# Patient Record
Sex: Male | Born: 2000
Health system: Southern US, Community
[De-identification: ages and names within clinical notes are randomized; demographics above are authoritative.]

## PROBLEM LIST (undated history)

## (undated) DIAGNOSIS — F840 Autistic disorder: Secondary | ICD-10-CM

## (undated) DIAGNOSIS — J309 Allergic rhinitis, unspecified: Secondary | ICD-10-CM

## (undated) DIAGNOSIS — R55 Syncope and collapse: Secondary | ICD-10-CM

## (undated) DIAGNOSIS — K219 Gastro-esophageal reflux disease without esophagitis: Secondary | ICD-10-CM

## (undated) DIAGNOSIS — F909 Attention-deficit hyperactivity disorder, unspecified type: Secondary | ICD-10-CM

## (undated) DIAGNOSIS — R634 Abnormal weight loss: Secondary | ICD-10-CM

## (undated) DIAGNOSIS — F419 Anxiety disorder, unspecified: Secondary | ICD-10-CM

## (undated) HISTORY — DX: Syncope and collapse: R55

## (undated) HISTORY — DX: Allergic rhinitis, unspecified: J30.9

## (undated) HISTORY — DX: Anxiety disorder, unspecified: F41.9

## (undated) HISTORY — DX: Abnormal weight loss: R63.4

## (undated) HISTORY — DX: Autistic disorder: F84.0

## (undated) HISTORY — DX: Gastro-esophageal reflux disease without esophagitis: K21.9

## (undated) HISTORY — DX: Attention-deficit hyperactivity disorder, unspecified type: F90.9

---
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
## 2018-01-09 ENCOUNTER — Other Ambulatory Visit: Payer: Self-pay

## 2018-01-17 ENCOUNTER — Ambulatory Visit (INDEPENDENT_AMBULATORY_CARE_PROVIDER_SITE_OTHER): Payer: 59 | Admitting: Neurology

## 2018-01-17 ENCOUNTER — Other Ambulatory Visit: Payer: Self-pay

## 2018-01-17 ENCOUNTER — Encounter: Payer: Self-pay | Admitting: Neurology

## 2018-01-17 VITALS — BP 130/66 | HR 80 | Ht 72.0 in | Wt 204.0 lb

## 2018-01-17 DIAGNOSIS — R55 Syncope and collapse: Secondary | ICD-10-CM

## 2018-01-17 DIAGNOSIS — G43109 Migraine with aura, not intractable, without status migrainosus: Secondary | ICD-10-CM | POA: Diagnosis not present

## 2018-01-17 NOTE — Progress Notes (Signed)
NEUROLOGY CONSULTATION NOTE  Theodore Woods MRN: [id number] DOB: 12/22/2000  Referring provider: Dr. Aggie Hacker Primary care provider: Dr. Aggie Hacker  Reason for consult:  Syncope, migraines  Dear Dr Hosie Poisson:  Thank you for your kind referral of Theodore Woods for consultation of the above symptoms. Although his history is well known to you, please allow me to reiterate it for the purpose of our medical record. The patient was accompanied to the clinic by his mother who also provides collateral information. Records and images were personally reviewed where available.  HISTORY OF PRESENT ILLNESS: This is an 17 year old right-handed man with a history of anxiety, ADHD, presenting for evaluation of recurrent episodes of loss of consciousness. He initially started having them when he was at band camp in the summer and was attributed to dehydration. He would start feeling really hot and lightheaded, then pass out. His mother had instructions for co-parents on what to do for him, co-parents have described him as being pale when they come to him. After the syncopal events, he would have a headache with right-sided throbbing, no nausea/vomiting. In December, he had an episode that occurred outside of band. He was standing then started feeling weird like something was wrong, then suddenly had a horrible headache and woke up on the ground. He states this headache was different with a lot more pain, nausea, and headache lasted for several hours after, with sensitivity to light and sound. His vision felt blurred. Another time he was inside an [employer] for a concert. He got up and started having a bad headache and feeling lightheaded. He sat down and felt like he could not move well. He told his teacher and sat inside a room and called his mother. His mother was upset that he was left alone, she "had to keep him from passing out," screaming at him on the phone because he was not making  sense trying to communicate with her. She kept screaming on the phone until he finally said "yeah" and she found him half-slouched on the wall sitting on the floor, pale and clammy with zero energy. He had not been doing anything strenuous that time. The last episode occurred in January, he was driving when he felt lightheaded and his head really hurt, similar to the headache in December. His father switched seats with him, and as he moved to the passenger seat he nearly passed out. They denied any true loss of consciousness this time. The headache again lasted for a few hours. He has not had any further syncopal episodes but has complained of feeling lightheaded. He usually has to lay down and go to sleep, Advil helps some with the headaches.   He and his mother report that he was having daily headaches in January and was "eating Advil." He started Claritin with a noticeable decrease in headaches last month. He has not needed Advil in a couple of weeks. He still has milder headaches around twice a week on the right side, but has talked to his mother about the headaches being all over as well. He has occasional high pitched tinnitus. He has occasional neck pain. He was previously on Lexapro then switched to Prozac last month for anxiety. He has not taken Xanax yet. He takes Concerta daily for ADHD. His mother denies any staring spells. He denies any olfactory/gustatory hallucinations, deja vu, rising epigastric sensation, focal numbness/tingling/weakness, myoclonic jerks. No diplopia, dysarthria/dysphagia, bowel/bladder dysfunction. Sleep is okay, he usually gest 8  hours of sleep. There is a family history of migraines in his mother and maternal grandmother.  He had a normal birth and early development.  There is no history of febrile convulsions, CNS infections such as meningitis/encephalitis, significant traumatic brain injury, neurosurgical procedures, or family history of seizures.  PAST MEDICAL  HISTORY: Past Medical History:  Diagnosis Date  . ADHD (attention deficit hyperactivity disorder)   . Allergic rhinitis   . Anxiety    Followed by Dr. Denman George and Dr. Toni Arthurs  . Autism spectrum disorder   . Weight loss     PAST SURGICAL HISTORY: History reviewed. No pertinent surgical history.  MEDICATIONS: Current Outpatient Medications on File Prior to Visit  Medication Sig Dispense Refill  . ALPRAZolam (XANAX) 0.5 MG tablet TAKE 1/2-1 TABLET AS NEEDED FOR ANXIETY/PANIC  2  . FLUoxetine (PROZAC) 10 MG tablet Take 10 mg by mouth daily.  5  . FLUoxetine (PROZAC) 20 MG capsule Take 20 mg by mouth daily.  12  . loratadine (CLARITIN) 10 MG tablet Take 10 mg by mouth daily as needed. For allergies.    . methylphenidate (CONCERTA) 54 MG CR tablet Take 54 mg by mouth every morning.    Marland Kitchen omeprazole (PRILOSEC) 20 MG capsule Take 20 mg by mouth 2 (two) times daily.  3  . Probiotic Product (PROBIOTIC FORMULA PO) Take 1 tablet by mouth daily.    Marland Kitchen ibuprofen (ADVIL,MOTRIN) 200 MG tablet Take 600 mg by mouth every 6 (six) hours as needed. For pain.     No current facility-administered medications on file prior to visit.     ALLERGIES: No Known Allergies  FAMILY HISTORY: Family History  Problem Relation Age of Onset  . Migraines Mother   . Anxiety disorder Mother   . Asthma Sister   . Heart disease Maternal Grandmother     SOCIAL HISTORY: Social History   Socioeconomic History  . Marital status: Single    Spouse name: Not on file  . Number of children: Not on file  . Years of education: Not on file  . Highest education level: Not on file  Social Needs  . Financial resource strain: Not on file  . Food insecurity - worry: Not on file  . Food insecurity - inability: Not on file  . Transportation needs - medical: Not on file  . Transportation needs - non-medical: Not on file  Occupational History  . Not on file  Tobacco Use  . Smoking status: Never Smoker  . Smokeless tobacco:  Never Used  Substance and Sexual Activity  . Alcohol use: No    Frequency: Never  . Drug use: No  . Sexual activity: Not on file  Other Topics Concern  . Not on file  Social History Narrative  . Not on file    REVIEW OF SYSTEMS: Constitutional: No fevers, chills, or sweats, no generalized fatigue, change in appetite Eyes: No visual changes, double vision, eye pain Ear, nose and throat: No hearing loss, ear pain, nasal congestion, sore throat Cardiovascular: No chest pain, palpitations Respiratory:  No shortness of breath at rest or with exertion, wheezes GastrointestinaI: No nausea, vomiting, diarrhea, abdominal pain, fecal incontinence Genitourinary:  No dysuria, urinary retention or frequency Musculoskeletal:  No neck pain, back pain Integumentary: No rash, pruritus, skin lesions Neurological: as above Psychiatric: No depression, insomnia, anxiety Endocrine: No palpitations, fatigue, diaphoresis, mood swings, change in appetite, change in weight, increased thirst Hematologic/Lymphatic:  No anemia, purpura, petechiae. Allergic/Immunologic: no itchy/runny eyes, nasal congestion, recent  allergic reactions, rashes  PHYSICAL EXAM: Vitals:   01/17/18 0929  BP: 130/66  Pulse: 80  SpO2: 98%   General: No acute distress Head:  Normocephalic/atraumatic Eyes: Fundoscopic exam shows bilateral sharp discs, no vessel changes, exudates, or hemorrhages Neck: supple, no paraspinal tenderness, full range of motion Back: No paraspinal tenderness Heart: regular rate and rhythm Lungs: Clear to auscultation bilaterally. Vascular: No carotid bruits. Skin/Extremities: No rash, no edema Neurological Exam: Mental status: alert and oriented to person, place, and time, no dysarthria or aphasia, Fund of knowledge is appropriate.  Recent and remote memory are intact. 3/3 delayed recall. Attention and concentration are normal.    Able to name objects and repeat phrases. Cranial nerves: CN I: not  tested CN II: pupils equal, round and reactive to light, visual fields intact, fundi unremarkable. CN III, IV, VI:  full range of motion, no nystagmus, no ptosis CN V: facial sensation intact CN VII: upper and lower face symmetric CN VIII: hearing intact to finger rub CN IX, X: gag intact, uvula midline CN XI: sternocleidomastoid and trapezius muscles intact CN XII: tongue midline Bulk & Tone: normal, no fasciculations. Motor: 5/5 throughout with no pronator drift. Sensation: intact to light touch, cold, pin, vibration and joint position sense.  No extinction to double simultaneous stimulation.  Romberg test negative Deep Tendon Reflexes: +2 throughout, no ankle clonus Plantar responses: downgoing bilaterally Cerebellar: no incoordination on finger to nose, heel to shin. No dysdiadochokinesia Gait: narrow-based and steady, able to tandem walk adequately. Tremor: none  IMPRESSION: This is an 17 year old right-handed man with a history of anxiety, ADHD, presenting for recurrent episodes of near syncope/syncope. Etiology of symptoms is unclear, considerations include vasovagal syncope, complicated migraines, cardiac cause (arrhythmias), less likely seizures. MRI brain with and without contrast and a 1-hour EEG will be ordered to assess for focal abnormalities that increase risk for recurrent seizures. Echocardiogram will be ordered for syncope. We discussed how Prozac can be useful as a headache preventative medication, he recently started it this month. He will follow-up after testing and knows to call for any changes.   Thank you for allowing me to participate in the care of this patient. Please do not hesitate to call for any questions or concerns.   Patrcia DollyKaren Nysia Dell, M.D.  CC: Dr. Hosie PoissonSumner

## 2018-01-17 NOTE — Patient Instructions (Addendum)
1. Schedule 1-hour EEG 2. Schedule echocardiogram 3. Schedule open MRI brain with and without contrast after June 2019  We have referred you to [facility] for your OPEN MRI.  They will be in touch to schedule your appointment.  Please arrive 30 minutes prior and go to [address]. If you need to reschedule for any reason please call [phone].  4. Continue all your medications, Prozac may help with headaches as well 5. Follow-up after MRI, call for any changes

## 2018-01-20 ENCOUNTER — Encounter: Payer: Self-pay | Admitting: Neurology

## 2018-01-27 ENCOUNTER — Telehealth: Payer: Self-pay | Admitting: Neurology

## 2018-01-27 NOTE — Telephone Encounter (Signed)
Patient's mother called needing to speak with you regarding his upcoming test 02/02/18 for an Routine EEG and a code? Please Call. Thanks

## 2018-02-02 ENCOUNTER — Other Ambulatory Visit: Payer: 59

## 2018-02-13 ENCOUNTER — Ambulatory Visit (INDEPENDENT_AMBULATORY_CARE_PROVIDER_SITE_OTHER): Payer: 59 | Admitting: Neurology

## 2018-02-13 ENCOUNTER — Telehealth: Payer: Self-pay

## 2018-02-13 ENCOUNTER — Telehealth: Payer: Self-pay | Admitting: Neurology

## 2018-02-13 DIAGNOSIS — G43109 Migraine with aura, not intractable, without status migrainosus: Secondary | ICD-10-CM

## 2018-02-13 DIAGNOSIS — R55 Syncope and collapse: Secondary | ICD-10-CM | POA: Diagnosis not present

## 2018-02-13 NOTE — Telephone Encounter (Signed)
LM with pt's mother, Samara DeistKathryn, relaying message below.  Also letting her know that a copy of the EEG report has been printed and placed at our front desk for pick up.

## 2018-02-13 NOTE — Telephone Encounter (Signed)
Patient's mother called and would like a copy of the report from his EEG. Thanks

## 2018-02-13 NOTE — Telephone Encounter (Signed)
-----   Message from Van ClinesKaren M Aquino, MD sent at 02/13/2018  1:23 PM EDT ----- Pls let him know EEG was normal, thanks

## 2018-02-13 NOTE — Procedures (Addendum)
ELECTROENCEPHALOGRAM REPORT  Date of Study: 02/13/2018  Patient's Name: Theodore Woods MRN: [id number] Date of Birth: 17-Apr-2001  Referring Provider: Dr. Patrcia DollyKaren Zaiyden Strozier  Clinical History: This is an 17 year old man with recurrent episodes of near syncope/syncope.  Medications: XANAX 0.5 MG tablet  PROZAC 10 MG tablet PROZAC 20 MG capsule  CLARITIN 10 MG tablet  CONCERTA 54 MG CR tablet  PRILOSEC 20 MG capsule  PROBIOTIC FORMULA PO  ADVIL,MOTRIN 200 MG tablet   Technical Summary: A multichannel digital 1-hour EEG recording measured by the international 10-20 system with electrodes applied with paste and impedances below 5000 ohms performed in our laboratory with EKG monitoring in an awake and drowsy patient.  Hyperventilation and photic stimulation were performed.  The digital EEG was referentially recorded, reformatted, and digitally filtered in a variety of bipolar and referential montages for optimal display.    Description: The patient is awake and drowsy during the recording.  During maximal wakefulness, there is a symmetric, medium voltage 9 Hz posterior dominant rhythm that attenuates with eye opening.  The record is symmetric.  During drowsiness, there is an increase in theta slowing of the background.  Deeper stages of sleep were not seen. Hyperventilation and photic stimulation did not elicit any abnormalities.  There were no epileptiform discharges or electrographic seizures seen.    EKG lead was unremarkable.  Impression: This 1-hour awake and drowsy EEG is normal.    Clinical Correlation: A normal EEG does not exclude a clinical diagnosis of epilepsy.  If further clinical questions remain, prolonged EEG may be helpful.  Clinical correlation is advised.   Patrcia DollyKaren Cassadie Pankonin, M.D.

## 2018-02-13 NOTE — Telephone Encounter (Signed)
EEG was done today - will print copy and place at front desk for pick up when available.

## 2018-02-21 ENCOUNTER — Ambulatory Visit: Payer: 59 | Admitting: Gastroenterology

## 2018-03-08 DIAGNOSIS — K219 Gastro-esophageal reflux disease without esophagitis: Secondary | ICD-10-CM

## 2018-03-08 DIAGNOSIS — F331 Major depressive disorder, recurrent, moderate: Secondary | ICD-10-CM | POA: Insufficient documentation

## 2018-03-08 DIAGNOSIS — G43909 Migraine, unspecified, not intractable, without status migrainosus: Secondary | ICD-10-CM | POA: Insufficient documentation

## 2018-03-08 DIAGNOSIS — R5383 Other fatigue: Secondary | ICD-10-CM

## 2018-03-08 DIAGNOSIS — Z79899 Other long term (current) drug therapy: Secondary | ICD-10-CM | POA: Insufficient documentation

## 2018-03-08 DIAGNOSIS — R634 Abnormal weight loss: Secondary | ICD-10-CM | POA: Insufficient documentation

## 2018-03-08 DIAGNOSIS — H9325 Central auditory processing disorder: Secondary | ICD-10-CM | POA: Insufficient documentation

## 2018-03-08 DIAGNOSIS — R5381 Other malaise: Secondary | ICD-10-CM | POA: Insufficient documentation

## 2018-03-08 DIAGNOSIS — L709 Acne, unspecified: Secondary | ICD-10-CM | POA: Insufficient documentation

## 2018-03-08 DIAGNOSIS — F4001 Agoraphobia with panic disorder: Secondary | ICD-10-CM | POA: Insufficient documentation

## 2018-03-08 HISTORY — DX: Gastro-esophageal reflux disease without esophagitis: K21.9

## 2018-03-09 DIAGNOSIS — F902 Attention-deficit hyperactivity disorder, combined type: Secondary | ICD-10-CM | POA: Insufficient documentation

## 2018-03-20 ENCOUNTER — Telehealth: Payer: Self-pay | Admitting: Neurology

## 2018-03-20 NOTE — Telephone Encounter (Signed)
Pt's mother called and said pt has a new doctor to send all office notes and reports please send  to Dr Feliciana Rossetti with [facility] and the number is [phone]

## 2018-03-20 NOTE — Telephone Encounter (Signed)
Pt's mother also asked if pt's EEG results can be mailed to pt instead of picking them up

## 2018-03-20 NOTE — Telephone Encounter (Signed)
Pt will need to sign release form prior to records being faxed/mailed.

## 2018-03-29 ENCOUNTER — Ambulatory Visit: Payer: 59 | Admitting: Gastroenterology

## 2018-05-09 ENCOUNTER — Ambulatory Visit: Payer: 59 | Admitting: Gastroenterology

## 2018-06-21 ENCOUNTER — Telehealth: Payer: Self-pay | Admitting: Neurology

## 2018-06-21 NOTE — Telephone Encounter (Signed)
Patient's mom called needing a Prior Auth for the MRI on 07/07/18. She had just gotten off the phone with the insurance company. She also wanted to let you know the patient has changed doctors to Dr. Feliciana RossettiGreg Grisso. He aged out of the pediatric's office he was going to. If you need to call her it is [phone]. Thanks.

## 2018-06-21 NOTE — Telephone Encounter (Signed)
Staff message sent to [employer] for prior authorization

## 2018-06-22 NOTE — Telephone Encounter (Signed)
Auth# [id number] valid from 08/21 until 10/05

## 2018-07-02 DIAGNOSIS — R55 Syncope and collapse: Secondary | ICD-10-CM | POA: Insufficient documentation

## 2018-07-02 HISTORY — DX: Syncope and collapse: R55

## 2018-07-05 ENCOUNTER — Other Ambulatory Visit: Payer: Self-pay

## 2018-07-05 ENCOUNTER — Telehealth: Payer: Self-pay | Admitting: Neurology

## 2018-07-05 DIAGNOSIS — R55 Syncope and collapse: Secondary | ICD-10-CM

## 2018-07-05 DIAGNOSIS — G43109 Migraine with aura, not intractable, without status migrainosus: Secondary | ICD-10-CM

## 2018-07-05 NOTE — Telephone Encounter (Signed)
Returned call to pt's mother.  Until this message I was unaware that pt is claustrophobic.  MRI order was placed with [facility].  Need to see if pt can have regular MRI with anit-anxiety or if MRI will need to be rescheduled with [facility].

## 2018-07-05 NOTE — Telephone Encounter (Signed)
Patient's mom called stating her insurance needs a Pre-Auth for the MRI scheduled on Friday. Also, she wanted to make sure the MRI was order to be an open MRI since her son is claustrophobic. He has recently changed Drs because he left the pediatric office for age; report will go to Dr. Feliciana Rossetti. His office Phone: [phone]); [place]. If you need to call her back it's [phone]. Thanks!

## 2018-07-05 NOTE — Telephone Encounter (Signed)
Spoke with pt's mother who states that she does not believe pt will be OK with the MRI machines at [facility].  Referral placed for [facility], staff message sent to [employer] for Prior Auth. Order faxed to [facility].

## 2018-07-07 ENCOUNTER — Telehealth: Payer: Self-pay | Admitting: Neurology

## 2018-07-07 ENCOUNTER — Other Ambulatory Visit: Payer: 59

## 2018-07-07 NOTE — Telephone Encounter (Signed)
Patient's mom called stating her son has an appointment today for an MRI at 1:30. She needs someone to call her back regarding if she needs to go to this appointment due to the location being changed for needing closed MRI. Please call her back ASAP if possible. Her number is [phone]. Thanks!

## 2018-07-07 NOTE — Telephone Encounter (Signed)
Spoke with mom and made her aware if they need a true OPEN machine the only place in the area who has that is [facility]. If they think he can handle a larger machine they have that at [facility] and should keep appt. They will decide.

## 2018-07-21 ENCOUNTER — Telehealth: Payer: Self-pay | Admitting: Neurology

## 2018-07-21 NOTE — Telephone Encounter (Signed)
Patient's mother called to check his MRI results. She wanted to make a follow up appointment. Please call with results and if follow up is needed next week. Thanks

## 2018-07-24 NOTE — Telephone Encounter (Signed)
Pls let mother know the MRI brain did not show any evidence of tumor, stroke, or bleed. Ok for [employer] slots end of Nov, can put on waitlist as well. Thanks

## 2018-07-25 ENCOUNTER — Telehealth: Payer: Self-pay | Admitting: Neurology

## 2018-07-25 NOTE — Telephone Encounter (Signed)
Patient's mother is calling in wanting to get his recent MRI Results. Please call her back at [phone]. Thanks!

## 2018-07-25 NOTE — Telephone Encounter (Signed)
Spoke with pt's mother.  Relayed normal MRI results.  Pt has appointment scheduled for 07/27/18 at 10AM.  Mother states that she and pt will be there, mother has some questions

## 2018-07-25 NOTE — Telephone Encounter (Signed)
LMOM for pt's mother relaying MRI results.

## 2018-07-27 ENCOUNTER — Ambulatory Visit: Payer: 59 | Admitting: Neurology

## 2018-07-27 ENCOUNTER — Encounter: Payer: Self-pay | Admitting: Neurology

## 2018-07-27 ENCOUNTER — Other Ambulatory Visit: Payer: Self-pay

## 2018-07-27 VITALS — BP 132/88 | HR 90 | Ht 72.0 in | Wt 200.0 lb

## 2018-07-27 DIAGNOSIS — R55 Syncope and collapse: Secondary | ICD-10-CM | POA: Diagnosis not present

## 2018-07-27 DIAGNOSIS — G43109 Migraine with aura, not intractable, without status migrainosus: Secondary | ICD-10-CM | POA: Diagnosis not present

## 2018-07-27 MED ORDER — TOPIRAMATE 25 MG PO TABS: ORAL_TABLET | ORAL | 11 refills | Status: DC

## 2018-07-27 NOTE — Progress Notes (Signed)
NEUROLOGY FOLLOW UP OFFICE NOTE  DELYNN PURSLEY [id number] 14-Dec-2000  HISTORY OF PRESENT ILLNESS: I had the pleasure of seeing Brysan Mcevoy in follow-up in the neurology clinic on 07/27/2018.  The patient was last seen 6 months ago for recurrent episodes of loss of consciousness. He is again accompanied by his mother who helps supplement the history today.  Records and images were personally reviewed where available.  MRI brain with and without contrast done 07/19/18 did not show any acute changes, there was note of small amount of ill-defined T2 FLAIR hyperintensity in the bilateral periatrial periventricular regions, differential diagnosis including small perivascular spaces versus sequela of a remote nonspecific insult. His 1-hour wake and drowsy EEG was normal. He has not yet done the echocardiogram. Since his last visit, they deny any full syncopal episodes since December 2018 but has had a near syncopal episode last Tuesday while sitting in class. He was having a headache, then he started feeling a wave of dizziness and felt like he would pass out. The headache started to worsen significantly. His mother reports these bad headaches occur maybe once a month, but he frequently complains of headaches multiple times a week and asks for Advil, needing to lie down. No nausea/vomiting. His dose of Prozac was increased to 30mg  recently, he was having some body jerks with increased dose, these have quieted down but still occur sometimes. He denies any diplopia, dysarthria, focal numbness/tingling/weakness, no falls.   History on Initial Assessment 01/17/2018: This is an 17 year old right-handed man with a history of anxiety, ADHD, presenting for evaluation of recurrent episodes of loss of consciousness. He initially started having them when he was at band camp in the summer and was attributed to dehydration. He would start feeling really hot and lightheaded, then pass out. His mother had instructions  for co-parents on what to do for him, co-parents have described him as being pale when they come to him. After the syncopal events, he would have a headache with right-sided throbbing, no nausea/vomiting. In December, he had an episode that occurred outside of band. He was standing then started feeling weird like something was wrong, then suddenly had a horrible headache and woke up on the ground. He states this headache was different with a lot more pain, nausea, and headache lasted for several hours after, with sensitivity to light and sound. His vision felt blurred. Another time he was inside an [employer] for a concert. He got up and started having a bad headache and feeling lightheaded. He sat down and felt like he could not move well. He told his teacher and sat inside a room and called his mother. His mother was upset that he was left alone, she "had to keep him from passing out," screaming at him on the phone because he was not making sense trying to communicate with her. She kept screaming on the phone until he finally said "yeah" and she found him half-slouched on the wall sitting on the floor, pale and clammy with zero energy. He had not been doing anything strenuous that time. The last episode occurred in January, he was driving when he felt lightheaded and his head really hurt, similar to the headache in December. His father switched seats with him, and as he moved to the passenger seat he nearly passed out. They denied any true loss of consciousness this time. The headache again lasted for a few hours. He has not had any further syncopal episodes but has  complained of feeling lightheaded. He usually has to lay down and go to sleep, Advil helps some with the headaches.   He and his mother report that he was having daily headaches in January and was "eating Advil." He started Claritin with a noticeable decrease in headaches last month. He has not needed Advil in a couple of weeks. He still  has milder headaches around twice a week on the right side, but has talked to his mother about the headaches being all over as well. He has occasional high pitched tinnitus. He has occasional neck pain. He was previously on Lexapro then switched to Prozac last month for anxiety. He has not taken Xanax yet. He takes Concerta daily for ADHD. His mother denies any staring spells. He denies any olfactory/gustatory hallucinations, deja vu, rising epigastric sensation, focal numbness/tingling/weakness, myoclonic jerks. No diplopia, dysarthria/dysphagia, bowel/bladder dysfunction. Sleep is okay, he usually gest 8 hours of sleep. There is a family history of migraines in his mother and maternal grandmother.  He had a normal birth and early development.  There is no history of febrile convulsions, CNS infections such as meningitis/encephalitis, significant traumatic brain injury, neurosurgical procedures, or family history of seizures  PAST MEDICAL HISTORY: Past Medical History:  Diagnosis Date  . ADHD (attention deficit hyperactivity disorder)   . Allergic rhinitis   . Anxiety    Followed by Dr. Denman George and Dr. Toni Arthurs  . Autism spectrum disorder   . Weight loss     MEDICATIONS: Current Outpatient Medications on File Prior to Visit  Medication Sig Dispense Refill  . ALPRAZolam (XANAX) 0.5 MG tablet TAKE 1/2-1 TABLET AS NEEDED FOR ANXIETY/PANIC  2  . FLUoxetine (PROZAC) 10 MG tablet Take 10 mg by mouth daily.  5  . FLUoxetine (PROZAC) 20 MG capsule Take 20 mg by mouth daily.  12  . ibuprofen (ADVIL,MOTRIN) 200 MG tablet Take 600 mg by mouth every 6 (six) hours as needed. For pain.    Marland Kitchen loratadine (CLARITIN) 10 MG tablet Take 10 mg by mouth daily as needed. For allergies.    . methylphenidate (CONCERTA) 54 MG CR tablet Take 54 mg by mouth every morning.    Marland Kitchen omeprazole (PRILOSEC) 20 MG capsule Take 20 mg by mouth 2 (two) times daily.  3  . Probiotic Product (PROBIOTIC FORMULA PO) Take 1 tablet by mouth  daily.     No current facility-administered medications on file prior to visit.     ALLERGIES: No Known Allergies  FAMILY HISTORY: Family History  Problem Relation Age of Onset  . Migraines Mother   . Anxiety disorder Mother   . Asthma Sister   . Heart disease Maternal Grandmother     SOCIAL HISTORY: Social History   Socioeconomic History  . Marital status: Single    Spouse name: Not on file  . Number of children: Not on file  . Years of education: Not on file  . Highest education level: Not on file  Occupational History  . Not on file  Social Needs  . Financial resource strain: Not on file  . Food insecurity:    Worry: Not on file    Inability: Not on file  . Transportation needs:    Medical: Not on file    Non-medical: Not on file  Tobacco Use  . Smoking status: Never Smoker  . Smokeless tobacco: Never Used  Substance and Sexual Activity  . Alcohol use: No    Frequency: Never  . Drug use: No  .  Sexual activity: Not on file  Lifestyle  . Physical activity:    Days per week: Not on file    Minutes per session: Not on file  . Stress: Not on file  Relationships  . Social connections:    Talks on phone: Not on file    Gets together: Not on file    Attends religious service: Not on file    Active member of club or organization: Not on file    Attends meetings of clubs or organizations: Not on file    Relationship status: Not on file  . Intimate partner violence:    Fear of current or ex partner: Not on file    Emotionally abused: Not on file    Physically abused: Not on file    Forced sexual activity: Not on file  Other Topics Concern  . Not on file  Social History Narrative   Pt lives in split level home with his parents, and his sister who is currently in [place] (home during [place])   Currently a senior in high school    REVIEW OF SYSTEMS: Constitutional: No fevers, chills, or sweats, no generalized fatigue, change in appetite Eyes: No visual  changes, double vision, eye pain Ear, nose and throat: No hearing loss, ear pain, nasal congestion, sore throat Cardiovascular: No chest pain, palpitations Respiratory:  No shortness of breath at rest or with exertion, wheezes GastrointestinaI: No nausea, vomiting, diarrhea, abdominal pain, fecal incontinence Genitourinary:  No dysuria, urinary retention or frequency Musculoskeletal:  No neck pain, back pain Integumentary: No rash, pruritus, skin lesions Neurological: as above Psychiatric: No depression, insomnia, anxiety Endocrine: No palpitations, fatigue, diaphoresis, mood swings, change in appetite, change in weight, increased thirst Hematologic/Lymphatic:  No anemia, purpura, petechiae. Allergic/Immunologic: no itchy/runny eyes, nasal congestion, recent allergic reactions, rashes  PHYSICAL EXAM: Vitals:   07/27/18 1015  BP: 132/88  Pulse: 90  SpO2: 98%   General: No acute distress, flat affect Head:  Normocephalic/atraumatic Neck: supple, no paraspinal tenderness, full range of motion Heart:  Regular rate and rhythm Lungs:  Clear to auscultation bilaterally Back: No paraspinal tenderness Skin/Extremities: No rash, no edema Neurological Exam: alert and oriented to person, place, and time. No aphasia or dysarthria. Fund of knowledge is appropriate.  Recent and remote memory are intact.  Attention and concentration are normal.    Able to name objects and repeat phrases. Cranial nerves: Pupils equal, round, reactive to light. Extraocular movements intact with no nystagmus. Visual fields full. Facial sensation intact. No facial asymmetry. Tongue, uvula, palate midline.  Motor: Bulk and tone normal, muscle strength 5/5 throughout with no pronator drift.  Sensation to light touch intact.  No extinction to double simultaneous stimulation. Finger to nose testing intact.  Gait narrow-based and steady, able to tandem walk adequately.  Romberg negative.  IMPRESSION: This is a 17 yo RH man  with a history of anxiety, ADHD, with recurrent episodes of near syncope/syncope with associated significant headaches. Last full syncopal episode was December 2018, he had a near syncopal episode 2 days ago. He reports headaches multiple times a week. We again discussed differential diagnosis, it appears these episodes are always associated with headaches, suggestive of complicated migraines, however would still proceed with echocardiogram for cardiac workup of syncope. His MRI brain and EEG were normal. He is agreeable to starting a daily migraine preventative medication, Topamax 25mg  qhs. He will keep a calendar of his migraines and update our office in 3 months, we may uptitrate as tolerated.  Side effects discussed. He will follow-up in 6 months and knows to call for any changes.  Thank you for allowing me to participate in his care.  Please do not hesitate to call for any questions or concerns.  The duration of this appointment visit was 30 minutes of face-to-face time with the patient.  Greater than 50% of this time was spent in counseling, explanation of diagnosis, planning of further management, and coordination of care.   Patrcia Dolly, M.D.   CC: Dr. Shary Decamp

## 2018-07-27 NOTE — Patient Instructions (Signed)
1. Start Topamax 25mg : take 1 tablet every night 2. Keep a calendar of your headaches (look into Migraine Buddy app) 3. Update me in 3 months on headache frequency, we can increase dose further if needed 4. Call our office once new insurance in place and we will send in order for Echocardiogram 5. Follow-up in 6 months, call for any changes

## 2018-07-31 ENCOUNTER — Encounter: Payer: Self-pay | Admitting: Neurology

## 2018-10-02 ENCOUNTER — Ambulatory Visit: Payer: 59 | Admitting: Neurology

## 2019-01-15 ENCOUNTER — Ambulatory Visit: Payer: 59 | Admitting: Neurology

## 2019-01-19 ENCOUNTER — Telehealth: Payer: Self-pay

## 2019-01-19 NOTE — Telephone Encounter (Signed)
LMOM advising that we are limiting in-person visits at this time.  Asked that pt return call to office.

## 2019-01-22 ENCOUNTER — Ambulatory Visit: Payer: 59 | Admitting: Neurology

## 2019-01-22 ENCOUNTER — Telehealth: Payer: Self-pay | Admitting: Neurology

## 2019-01-22 NOTE — Telephone Encounter (Signed)
Patient mother is returning your call and would like to set up a e visit or telephone visit

## 2019-01-23 NOTE — Telephone Encounter (Signed)
Yes we will call to get patient sch  Thank you  Theodore Woods

## 2019-01-23 NOTE — Telephone Encounter (Signed)
Can someone please call and schedule a telephonic / Webex visit?

## 2019-02-05 ENCOUNTER — Encounter: Payer: Self-pay | Admitting: Neurology

## 2019-03-07 ENCOUNTER — Other Ambulatory Visit: Payer: Self-pay

## 2019-03-07 ENCOUNTER — Telehealth (INDEPENDENT_AMBULATORY_CARE_PROVIDER_SITE_OTHER): Payer: 59 | Admitting: Neurology

## 2019-03-07 DIAGNOSIS — G43109 Migraine with aura, not intractable, without status migrainosus: Secondary | ICD-10-CM

## 2019-03-07 DIAGNOSIS — R55 Syncope and collapse: Secondary | ICD-10-CM

## 2019-03-07 NOTE — Progress Notes (Signed)
Virtual Visit via Video Note The purpose of this virtual visit is to provide medical care while limiting exposure to the novel coronavirus.    Consent was obtained for video visit:  Yes.   Answered questions that patient had about telehealth interaction:  Yes.   I discussed the limitations, risks, security and privacy concerns of performing an evaluation and management service by telemedicine. I also discussed with the patient that there may be a patient responsible charge related to this service. The patient expressed understanding and agreed to proceed.  Pt location: Home Physician Location: office Name of referring provider:  Gordan PaymentGrisso, Greg A., MD I connected with Theodore Woods at patients initiation/request on 03/07/2019 at 11:30 AM EDT by video enabled telemedicine application and verified that I am speaking with the correct person using two identifiers. Pt MRN:  [id number] Pt DOB:  04-Mar-2001 Video Participants:  Theodore Woods;  Georgia DuffKathryn Samson (mother)   History of Present Illness:  The patient was last seen in September 2019 for recurrent episodes of loss of consciousness. His mother is present during the e-visit to provide additional information. MRI brain and EEG unremarkable. He has not yet done the echocardiogram. With the syncopal/near syncopal episodes, he would usually have headaches, and we discussed complicated migraines and prophylactic treatment with Topiramate. They report that he had not started the Topiramate because he is on so many medications. He has not fully passed out since December 2018, but had one "feeling" while in class in the Fall. His migraines are not occurring very frequently, but he does have frequent "regular headaches" where he takes Advil 2-3 times a week, usually in the evenings. He denies any dizziness, vision changes, focal numbness/tingling/weakness.   History on Initial Assessment 01/17/2018: This is an 18 year old right-handed man with a  history of anxiety, ADHD, presenting for evaluation of recurrent episodes of loss of consciousness. He initially started having them when he was at band camp in the summer and was attributed to dehydration. He would start feeling really hot and lightheaded, then pass out. His mother had instructions for co-parents on what to do for him, co-parents have described him as being pale when they come to him. After the syncopal events, he would have a headache with right-sided throbbing, no nausea/vomiting. In December, he had an episode that occurred outside of band. He was standing then started feeling weird like something was wrong, then suddenly had a horrible headache and woke up on the ground. He states this headache was different with a lot more pain, nausea, and headache lasted for several hours after, with sensitivity to light and sound. His vision felt blurred. Another time he was inside an [employer] preparing for a concert. He got up and started having a bad headache and feeling lightheaded. He sat down and felt like he could not move well. He told his teacher and sat inside a room and called his mother. His mother was upset that he was left alone, she "had to keep him from passing out," screaming at him on the phone because he was not making sense trying to communicate with her. She kept screaming on the phone until he finally said "yeah" and she found him half-slouched on the wall sitting on the floor, pale and clammy with zero energy. He had not been doing anything strenuous that time. The last episode occurred in January, he was driving when he felt lightheaded and his head really hurt, similar to the headache in December.  His father switched seats with him, and as he moved to the passenger seat he nearly passed out. They denied any true loss of consciousness this time. The headache again lasted for a few hours. He has not had any further syncopal episodes but has complained of feeling lightheaded. He  usually has to lay down and go to sleep, Advil helps some with the headaches.   He and his mother report that he was having daily headaches in January and was "eating Advil." He started Claritin with a noticeable decrease in headaches last month. He has not needed Advil in a couple of weeks. He still has milder headaches around twice a week on the right side, but has talked to his mother about the headaches being all over as well. He has occasional high pitched tinnitus. He has occasional neck pain. He was previously on Lexapro then switched to Prozac last month for anxiety. He has not taken Xanax yet. He takes Concerta daily for ADHD. His mother denies any staring spells. He denies any olfactory/gustatory hallucinations, deja vu, rising epigastric sensation, focal numbness/tingling/weakness, myoclonic jerks. No diplopia, dysarthria/dysphagia, bowel/bladder dysfunction. Sleep is okay, he usually gest 8 hours of sleep. There is a family history of migraines in his mother and maternal grandmother.  He had a normal birth and early development.  There is no history of febrile convulsions, CNS infections such as meningitis/encephalitis, significant traumatic brain injury, neurosurgical procedures, or family history of seizures    Observations/Objective:   Patient is awake, alert, oriented x 3. No aphasia or dysarthria. Intact fluency and comprehension. Remote and recent memory intact. Able to name and repeat. Cranial nerves: Extraocular movements intact with no nystagmus. No facial asymmetry. Motor: moves all extremities symmetrically, at least anti-gravity x 4. No incoordination on finger to nose testing. Gait: narrow-based and steady, able to tandem walk adequately. Negative Romberg test.  Assessment and Plan:   This is a 18 yo RH man with a history of anxiety, ADHD, with recurrent episodes of near syncope/syncope with associated significant headaches. Last full syncopal episode was December 2018, the last  near syncopal episode was in the Fall. He continues to report headaches multiple times a week. MRI brain and EEG unremarkable, current working diagnosis is complicated migraines, however would still proceed with echocardiogram for cardiac workup of syncope. We have agreed to hold off on starting Topiramate at this time, he was instructed to keep a calendar of his headaches and advised to minimize rescue medication to 2-3 times a week to avoid rebound headaches. He will follow-up in 6 months and knows to call for any changes.  Thank you for allowing me to participate in his care.  Please do not hesitate to call for any questions or concerns.   Patrcia Dolly, M.D.

## 2019-03-07 NOTE — Progress Notes (Signed)
Order placed and faxed to [facility] per pt mothers request to fax number [phone]

## 2019-03-08 ENCOUNTER — Telehealth: Payer: Self-pay | Admitting: Neurology

## 2019-03-08 ENCOUNTER — Telehealth: Payer: Self-pay

## 2019-03-08 NOTE — Telephone Encounter (Signed)
Pt called and this nurse was given verbal permission to fax his last office note to [place] at [facility] where his ECHO was ordered

## 2019-03-08 NOTE — Telephone Encounter (Signed)
CEO called regarding this patient and needing his echo that was ordered, the reason faxed over to [phone]. He would like our cover sheet with it so he can fax [place] some information. Please make attention to [facility]. Thanks

## 2019-03-13 ENCOUNTER — Telehealth: Payer: Self-pay | Admitting: Neurology

## 2019-03-13 ENCOUNTER — Telehealth: Payer: Self-pay

## 2019-03-13 NOTE — Telephone Encounter (Signed)
Echocardiogram done at [facility] done 03/08/2019: Normal LV size and LV function, EF 55-60%, normal left ventricular wall thickness, normal diastolic function.  Right ventricle: moderately dilated right ventricle, normal RV systolic function  Left atrium: normal size  Right atrium: mildly dilated right atrium  Trace tricuspid regurgitation.

## 2019-03-13 NOTE — Telephone Encounter (Signed)
-----   Message from Van Clines, MD sent at 03/13/2019  3:24 PM EDT ----- Regarding: cardiology referral Pls let mother know that Theodore Woods's echo was not completely normal, one of the chambers in his heart, the right ventricle, was larger compared to the left. I'm not quite sure what this means but with passing out episodes, I would like him to be followed by a Cardiologist. If she has a preference where to send referral to, pls send, otherwise, refer to [facility], thanks!

## 2019-03-13 NOTE — Telephone Encounter (Signed)
Spoke with Pts mom and informed her of PT echo results she will call me tomorrow with who she would like for me to send the referral to for cardiology

## 2019-03-14 ENCOUNTER — Other Ambulatory Visit: Payer: Self-pay

## 2019-03-14 DIAGNOSIS — R931 Abnormal findings on diagnostic imaging of heart and coronary circulation: Secondary | ICD-10-CM

## 2019-03-14 DIAGNOSIS — R55 Syncope and collapse: Secondary | ICD-10-CM

## 2019-03-15 ENCOUNTER — Encounter: Payer: Self-pay | Admitting: *Deleted

## 2019-03-15 DIAGNOSIS — J309 Allergic rhinitis, unspecified: Secondary | ICD-10-CM | POA: Insufficient documentation

## 2019-03-15 DIAGNOSIS — F419 Anxiety disorder, unspecified: Secondary | ICD-10-CM | POA: Insufficient documentation

## 2019-03-15 DIAGNOSIS — F909 Attention-deficit hyperactivity disorder, unspecified type: Secondary | ICD-10-CM | POA: Insufficient documentation

## 2019-03-15 DIAGNOSIS — F840 Autistic disorder: Secondary | ICD-10-CM | POA: Insufficient documentation

## 2019-03-22 ENCOUNTER — Other Ambulatory Visit: Payer: Self-pay

## 2019-03-22 ENCOUNTER — Telehealth: Payer: Self-pay | Admitting: Emergency Medicine

## 2019-03-22 ENCOUNTER — Encounter: Payer: Self-pay | Admitting: Cardiology

## 2019-03-22 ENCOUNTER — Telehealth (INDEPENDENT_AMBULATORY_CARE_PROVIDER_SITE_OTHER): Payer: 59 | Admitting: Cardiology

## 2019-03-22 VITALS — Ht 72.0 in | Wt 200.0 lb

## 2019-03-22 DIAGNOSIS — F419 Anxiety disorder, unspecified: Secondary | ICD-10-CM | POA: Diagnosis not present

## 2019-03-22 DIAGNOSIS — F902 Attention-deficit hyperactivity disorder, combined type: Secondary | ICD-10-CM | POA: Diagnosis not present

## 2019-03-22 DIAGNOSIS — R0602 Shortness of breath: Secondary | ICD-10-CM

## 2019-03-22 DIAGNOSIS — R0789 Other chest pain: Secondary | ICD-10-CM

## 2019-03-22 DIAGNOSIS — R55 Syncope and collapse: Secondary | ICD-10-CM

## 2019-03-22 DIAGNOSIS — R0600 Dyspnea, unspecified: Secondary | ICD-10-CM

## 2019-03-22 DIAGNOSIS — R0609 Other forms of dyspnea: Secondary | ICD-10-CM | POA: Insufficient documentation

## 2019-03-22 DIAGNOSIS — Z79899 Other long term (current) drug therapy: Secondary | ICD-10-CM

## 2019-03-22 NOTE — Progress Notes (Signed)
Virtual Visit via Video Note   This visit type was conducted due to national recommendations for restrictions regarding the COVID-19 Pandemic (e.g. social distancing) in an effort to limit this patient's exposure and mitigate transmission in our community.  Due to his co-morbid illnesses, this patient is at least at moderate risk for complications without adequate follow up.  This format is felt to be most appropriate for this patient at this time.  All issues noted in this document were discussed and addressed.  A limited physical exam was performed with this format.  Please refer to the patient's chart for his consent to telehealth for [facility].  Evaluation Performed:  Follow-up visit  This visit type was conducted due to national recommendations for restrictions regarding the COVID-19 Pandemic (e.g. social distancing).  This format is felt to be most appropriate for this patient at this time.  All issues noted in this document were discussed and addressed.  No physical exam was performed (except for noted visual exam findings with Video Visits).  Please refer to the patient's chart (MyChart message for video visits and phone note for telephone visits) for the patient's consent to telehealth for [facility].  Date:  03/22/2019  ID: Theodore Woods, DOB [birth year], MRN [id number]   Patient Location: [address] [facility] [place] [address]   Provider location:   [facility] [place] [facility]  PCP:  Gordan Payment., MD  Cardiologist:  Gypsy Balsam, MD     Chief Complaint: I have an abnormal echocardiogram and I am passing out  History of Present Illness:    Theodore Woods is a 18 y.o. male  who presents via audio/video conferencing for a telehealth visit today.  With past medical history significant for attention deficit disorder, anxiety, autism with spectrum disorder.  He was referred to [place] because of episode of syncope.  He described frequent episode of syncope about  20-23 in his life.  Usually happen when he is standing or when he sitting down he will start feeling poorly sometimes sweaty no nausea, then he started having difficulty seeing his vision becomes dark and then he falls down he never injured himself.  After he had episode like this he feels poorly all day.  Another complaint that he got is the fact that he got chest pain.  Chest pain he described as some uneasy sensation in the chest lasting for few minutes although this sensation can happen at any time he could be sitting or laying down and he will get it usually does and not provoked by exercise.  He described also situation of shortness of breath with exercise.  He does not exercise on the regular basis but very much concerned about his symptoms now.  Echocardiogram has been done and echocardiogram was normal except for moderately dilated right ventricle with normal systolic function as well as right atrium enlargement no evidence of pulmonary hypertension. There is no swelling of lower extremities there is no palpitations there is no family history of sudden cardiac death.   The patient does not have symptoms concerning for COVID-19 infection (fever, chills, cough, or new SHORTNESS OF BREATH).    Prior CV studies:   The following studies were reviewed today:  Echocardiogram done on seventh of this month showed normal left ventricular systolic function normal left ventricle size normal diastolic function moderately dilated right ventricle with mildly dilated right atrium trace tricuspid regurgitation. EKG from last year showed normal sinus rhythm normal P interval normal QS  complex duration morphology no ST-T segment changes     Past Medical History:  Diagnosis Date  . ADHD (attention deficit hyperactivity disorder)   . Allergic rhinitis   . Anxiety    Followed by Dr. Denman George and Dr. Toni Arthurs  . Autism spectrum disorder   . GERD without esophagitis 03/08/2018  . Syncope 07/2018  . Weight loss      No past surgical history on file.   Current Meds  Medication Sig  . ALPRAZolam (XANAX) 0.5 MG tablet TAKE 1/2-1 TABLET AS NEEDED FOR ANXIETY/PANIC  . FLUoxetine (PROZAC) 10 MG tablet Take 10 mg by mouth daily.  Marland Kitchen FLUoxetine (PROZAC) 20 MG capsule Take 20 mg by mouth daily.  Marland Kitchen ibuprofen (ADVIL,MOTRIN) 200 MG tablet Take 600 mg by mouth every 6 (six) hours as needed. For pain.  Marland Kitchen loratadine (CLARITIN) 10 MG tablet Take 10 mg by mouth daily as needed. For allergies.  . methylphenidate (CONCERTA) 54 MG CR tablet Take 54 mg by mouth every morning.  Marland Kitchen omeprazole (PRILOSEC) 20 MG capsule Take 20 mg by mouth every other day.   . Probiotic Product (PROBIOTIC FORMULA PO) Take 1 tablet by mouth daily.      Family History: The patient's family history includes Anxiety disorder in his mother; Asthma in his sister; Bladder Cancer in his maternal grandfather; Cancer in his paternal grandfather; Diabetes in his father; Heart disease in his maternal grandmother; Hyperlipidemia in his maternal grandmother; Hypertension in his father and maternal grandmother; Migraines in his mother.   ROS:   Please see the history of present illness.     All other systems reviewed and are negative.   Labs/Other Tests and Data Reviewed:     Recent Labs: No results found for requested labs within last 8760 hours.  Recent Lipid Panel No results found for: CHOL, TRIG, HDL, CHOLHDL, VLDL, LDLCALC, LDLDIRECT    Exam:    Vital Signs:  Ht 6' (1.829 m)   Wt 200 lb (90.7 kg)   BMI 27.12 kg/m     Wt Readings from Last 3 Encounters:  03/22/19 200 lb (90.7 kg) (92 %, Z= 1.40)*  03/06/19 200 lb (90.7 kg) (92 %, Z= 1.40)*  07/27/18 200 lb (90.7 kg) (93 %, Z= 1.46)*   * Growth percentiles are based on CDC (Boys, 2-20 Years) data.     Well nourished, well developed in no acute distress. Alert awake oriented x3 initially was started as a video visit however quality of collection was poor and we end up continuing  on the phone.  He was not in any distress he was somewhat slow in responding his mother was present during that visit actually there were in the car and she was helping [place].  Diagnosis for this visit:   1. Vasovagal syncope   2. Attention deficit hyperactivity disorder (ADHD), combined type   3. High risk medication use   4. Anxiety   5. Atypical chest pain   6. Dyspnea on exertion      ASSESSMENT & PLAN:    1.  Syncope appears to be vasovagal from the description however with the fact that he got enlarged right ventricle of course more significant issue need to be considered including arrhythmia.  I gave him advised this and told them that this is most likely vasovagal syncope I advised him to drink more fluid and make sure that he will stay well-hydrated also when he gets some symptoms feeling like he is going to pass out him  to sit down and put his head down.  I will also ask him to wear ZIO patch for 2 weeks to make sure there is no inducible arrhythmia.  Because of abnormality of the right ventricle and syncope I think we need to go much deeper into his potential problem.  I will schedule him to have MRI of his heart to look at the right ventricle size and potentially rule out right ventricle arrhythmogenic cardiomyopathy.  Luckily EKG did not show anything that would suggest that.  Until then I told him not to exercise heavily.  I also asked him to let me know if he got another episode of syncope. 2.  Attention deficit disorder.  Followed by internal medicine team. 3.  High risk medication use.  I think we can continue for now but in the future if we discover some real problem me we may be forced to change those medications. 4.  Anxiety seems to be under control right now of course will probably have some difficulty doing MRI but we just cannot MRI of his brain done therefore I think will be able to do MRI of his heart as well. 5.  Atypical chest pain look like not cardiac but I think it  would be reasonable to do MRI of his heart to look at the origin of his neck coronary arteries make sure he does not have any congenital problem I doubt very much at this age to have significant coronary artery disease. 6.  Dyspnea on exertion most likely multifactorial again MRI of his heart will be done.  COVID-19 Education: The signs and symptoms of COVID-19 were discussed with the patient and how to seek care for testing (follow up with PCP or arrange E-visit).  The importance of social distancing was discussed today.  Patient Risk:   After full review of this patients clinical status, I feel that they are at least moderate risk at this time.  Time:   Today, I have spent 25 minutes with the patient with telehealth technology discussing pt health issues.  I spent 5 minutes reviewing her chart before the visit.  Visit was finished at 2:17 PM.    Medication Adjustments/Labs and Tests Ordered: Current medicines are reviewed at length with the patient today.  Concerns regarding medicines are outlined above.  No orders of the defined types were placed in this encounter.  Medication changes: No orders of the defined types were placed in this encounter.    Disposition: Follow-up in 1 month  Signed, Georgeanna Leaobert J. Lavetta Geier, MD, [facility] 03/22/2019 2:15 PM    [facility]

## 2019-03-22 NOTE — Patient Instructions (Signed)
Medication Instructions:  Your physician recommends that you continue on your current medications as directed. Please refer to the Current Medication list given to you today.  If you need a refill on your cardiac medications before your next appointment, please call your pharmacy.   Lab work: None.  If you have labs (blood work) drawn today and your tests are completely normal, you will receive your results only by: Marland Kitchen MyChart Message (if you have MyChart) OR . A paper copy in the mail If you have any lab test that is abnormal or we need to change your treatment, we will call you to review the results.  Testing/Procedures: Your physician has recommended that you wear a holter monitor. Holter monitors are medical devices that record the heart's electrical activity. Doctors most often use these monitors to diagnose arrhythmias. Arrhythmias are problems with the speed or rhythm of the heartbeat. The monitor is a small, portable device. You can wear one while you do your normal daily activities. This is usually used to diagnose what is causing palpitations/syncope (passing out). Wear for 14 days.   Your physician has requested that you have a cardiac MRI. Cardiac MRI uses a computer to create images of your heart as its beating, producing both still and moving pictures of your heart and major blood vessels. For further information please visit [email or web address]. Please follow the instruction sheet given to you today for more information.     Follow-Up: At [facility], you and your health needs are our priority.  As part of our continuing mission to provide you with exceptional heart care, we have created designated Provider Care Teams.  These Care Teams include your primary Cardiologist (physician) and Advanced Practice Providers (APPs -  Physician Assistants and Nurse Practitioners) who all work together to provide you with the care you need, when you need it. You will need a follow up  appointment in 1 months.  Please call our office 2 months in advance to schedule this appointment.  You may see No primary care provider on file. or another member of our [employer] Provider Team in : Norman Herrlich, MD . Belva Crome, MD  Any Other Special Instructions Will Be Listed Below (If Applicable).

## 2019-03-22 NOTE — Telephone Encounter (Signed)
Left message for patient to return call regarding 1 month follow up, cardiac mri, and monitor.

## 2019-03-23 ENCOUNTER — Telehealth: Payer: Self-pay | Admitting: Emergency Medicine

## 2019-03-23 NOTE — Telephone Encounter (Signed)
Patient's mother called back asking about discharge instructions from yesterday. Informed her of cardiac mri, monitor and 1 month follow up appointment that is needed for patient. Advised her someone will call about mri and to call and let us know if she doesn't hear from the. I also informed patient monitor will be mailed to the patient and advised her to call if she doesn't received this by June 1st. Scheduled patient for follow up appointment. No further questions.

## 2019-04-03 ENCOUNTER — Other Ambulatory Visit (INDEPENDENT_AMBULATORY_CARE_PROVIDER_SITE_OTHER): Payer: 59

## 2019-04-03 DIAGNOSIS — R55 Syncope and collapse: Secondary | ICD-10-CM | POA: Diagnosis not present

## 2019-04-09 ENCOUNTER — Telehealth: Payer: Self-pay | Admitting: Cardiology

## 2019-04-09 NOTE — Telephone Encounter (Signed)
Patient's mother requesting a call back.  She has not heard anything regarding scheduling her son's mRI

## 2019-04-10 NOTE — Telephone Encounter (Signed)
[place] coordinator or the MRI scheduling. Will contact patient's mother back when I have a update.

## 2019-04-12 NOTE — Telephone Encounter (Signed)
Spoke to patients mother and informed her that the schedulers still haven't heard anything they are having limited scheduling right now. I let her know that they should have an update next week. I advised her to call us next week if she still hasn't heard anything. She verbally understands.

## 2019-04-19 ENCOUNTER — Telehealth: Payer: Self-pay | Admitting: *Deleted

## 2019-04-19 NOTE — Telephone Encounter (Signed)
Spoke with patient's mother regarding [facility] MRI scheduled for 05/10/19 at 12:00 pm---arrival time is 11:15am at the 1st floor admitting office for registration.  No restrictions for patient and I will mail information to the patient.  I also relayed the information regarding the MRI machine---this machine is wider and shorter than the standard MRI machine.  Mrs. Delker voiced her understanding

## 2019-04-20 ENCOUNTER — Ambulatory Visit (INDEPENDENT_AMBULATORY_CARE_PROVIDER_SITE_OTHER): Payer: 59 | Admitting: Cardiology

## 2019-04-20 ENCOUNTER — Other Ambulatory Visit: Payer: Self-pay

## 2019-04-20 ENCOUNTER — Encounter: Payer: Self-pay | Admitting: Cardiology

## 2019-04-20 VITALS — BP 114/74 | HR 100 | Wt 216.0 lb

## 2019-04-20 DIAGNOSIS — R0789 Other chest pain: Secondary | ICD-10-CM

## 2019-04-20 DIAGNOSIS — R0609 Other forms of dyspnea: Secondary | ICD-10-CM | POA: Diagnosis not present

## 2019-04-20 DIAGNOSIS — I517 Cardiomegaly: Secondary | ICD-10-CM

## 2019-04-20 DIAGNOSIS — R55 Syncope and collapse: Secondary | ICD-10-CM

## 2019-04-20 NOTE — Addendum Note (Signed)
Addended by: Jerl Santos R on: 04/20/2019 02:47 PM   Modules accepted: Orders

## 2019-04-20 NOTE — Progress Notes (Signed)
Cardiology Office Note:    Date:  04/20/2019   ID:  Theodore Woods, DOB 03-10-2001, MRN [id number]  PCP:  Gordan PaymentGrisso, Greg A., MD  Cardiologist:  Gypsy Balsamobert Xitlali Kastens, MD    Referring MD: Gordan PaymentGrisso, Greg A., MD   Chief Complaint  Patient presents with  . 1 month follow up  I am doing fine  History of Present Illness:    Theodore PuntCameron A Abbett is a 18 y.o. male who was referred to me because of frequent episode of syncope.  From the description he gave me look like this is vasovagal syncope however some of those episodes are worse usually happens when he is standing up he started feeling woozy dizzy and then he can passed out he passed out about 20 times in his life however he also described episode of passing out when he is sitting.  On top of that echocardiogram was done by primary care physician which showed enlargement of the right ventricle.  Obviously concern is about the right ventricle dysplasia.  Otherwise doing fine described to have some atypical chest pain sharp stabbing not related to exercise also some dyspnea on exertion but otherwise doing well  Past Medical History:  Diagnosis Date  . ADHD (attention deficit hyperactivity disorder)   . Allergic rhinitis   . Anxiety    Followed by Dr. Denman GeorgeGoff and Dr. Toni ArthursFuller  . Autism spectrum disorder   . GERD without esophagitis 03/08/2018  . Syncope 07/2018  . Weight loss     No past surgical history on file.  Current Medications: Current Meds  Medication Sig  . ALPRAZolam (XANAX) 0.5 MG tablet TAKE 1/2-1 TABLET AS NEEDED FOR ANXIETY/PANIC  . FLUoxetine (PROZAC) 10 MG tablet Take 10 mg by mouth daily.  Marland Kitchen. FLUoxetine (PROZAC) 20 MG capsule Take 20 mg by mouth daily.  Marland Kitchen. ibuprofen (ADVIL,MOTRIN) 200 MG tablet Take 600 mg by mouth every 6 (six) hours as needed. For pain.  Marland Kitchen. loratadine (CLARITIN) 10 MG tablet Take 10 mg by mouth daily as needed. For allergies.  . methylphenidate (CONCERTA) 54 MG CR tablet Take 54 mg by mouth every morning.  Marland Kitchen.  omeprazole (PRILOSEC) 20 MG capsule Take 20 mg by mouth every other day.   . Probiotic Product (PROBIOTIC FORMULA PO) Take 1 tablet by mouth daily.     Allergies:   Patient has no known allergies.   Social History   Socioeconomic History  . Marital status: Single    Spouse name: Not on file  . Number of children: Not on file  . Years of education: Not on file  . Highest education level: Not on file  Occupational History  . Not on file  Social Needs  . Financial resource strain: Not on file  . Food insecurity    Worry: Not on file    Inability: Not on file  . Transportation needs    Medical: Not on file    Non-medical: Not on file  Tobacco Use  . Smoking status: Never Smoker  . Smokeless tobacco: Never Used  Substance and Sexual Activity  . Alcohol use: No    Frequency: Never  . Drug use: No  . Sexual activity: Not on file  Lifestyle  . Physical activity    Days per week: Not on file    Minutes per session: Not on file  . Stress: Not on file  Relationships  . Social Musicianconnections    Talks on phone: Not on file    Gets together: Not on  file    Attends religious service: Not on file    Active member of club or organization: Not on file    Attends meetings of clubs or organizations: Not on file    Relationship status: Not on file  Other Topics Concern  . Not on file  Social History Narrative   Pt lives in split level home with his parents, and his sister who is currently in [place] (home during [place])   Currently a senior in high school     Family History: The patient's family history includes Anxiety disorder in his mother; Asthma in his sister; Bladder Cancer in his maternal grandfather; Cancer in his paternal grandfather; Diabetes in his father; Heart disease in his maternal grandmother; Hyperlipidemia in his maternal grandmother; Hypertension in his father and maternal grandmother; Migraines in his mother. ROS:   Please see the history of present illness.     All 14 point review of systems negative except as described per history of present illness  EKGs/Labs/Other Studies Reviewed:      Recent Labs: No results found for requested labs within last 8760 hours.  Recent Lipid Panel No results found for: CHOL, TRIG, HDL, CHOLHDL, VLDL, LDLCALC, LDLDIRECT  Physical Exam:    VS:  BP 114/74   Pulse 100   Wt 216 lb (98 kg)   SpO2 98%   BMI 29.29 kg/m     Wt Readings from Last 3 Encounters:  04/20/19 216 lb (98 kg) (96 %, Z= 1.76)*  03/22/19 200 lb (90.7 kg) (92 %, Z= 1.40)*  03/06/19 200 lb (90.7 kg) (92 %, Z= 1.40)*   * Growth percentiles are based on CDC (Boys, 2-20 Years) data.     GEN:  Well nourished, well developed in no acute distress HEENT: Normal NECK: No JVD; No carotid bruits LYMPHATICS: No lymphadenopathy CARDIAC: RRR, no murmurs, no rubs, no gallops RESPIRATORY:  Clear to auscultation without rales, wheezing or rhonchi  ABDOMEN: Soft, non-tender, non-distended MUSCULOSKELETAL:  No edema; No deformity  SKIN: Warm and dry LOWER EXTREMITIES: no swelling NEUROLOGIC:  Alert and oriented x 3 PSYCHIATRIC:  Normal affect   ASSESSMENT:    1. Vasovagal syncope   2. Atypical chest pain   3. Dyspnea on exertion   4. Right ventricular enlargement    PLAN:    In order of problems listed above:  1. Vasovagal syncope encouraged him to drink plenty of fluids liberate salt intake and I recommend sit down when he feels that the sensation is coming. 2. Atypical chest pain denies having any 3. Dyspnea on exertion again echocardiogram showed enlargement of the right ventricle MRI will be done to clarify this better. 4. Right ventricular enlargement he did wear monitor already not available yet.  I will make sure that he get MRI done.   Medication Adjustments/Labs and Tests Ordered: Current medicines are reviewed at length with the patient today.  Concerns regarding medicines are outlined above.  No orders of the defined types  were placed in this encounter.  Medication changes: No orders of the defined types were placed in this encounter.   Signed, Park Liter, MD, [facility] 04/20/2019 2:34 PM    [place]

## 2019-04-20 NOTE — Patient Instructions (Signed)
Medication Instructions:  Your physician recommends that you continue on your current medications as directed. Please refer to the Current Medication list given to you today.  If you need a refill on your cardiac medications before your next appointment, please call your pharmacy.   Lab work: None ordered If you have labs (blood work) drawn today and your tests are completely normal, you will receive your results only by: . MyChart Message (if you have MyChart) OR . A paper copy in the mail If you have any lab test that is abnormal or we need to change your treatment, we will call you to review the results.  Testing/Procedures: None ordered  Follow-Up: At [facility], you and your health needs are our priority.  As part of our continuing mission to provide you with exceptional heart care, we have created designated Provider Care Teams.  These Care Teams include your primary Cardiologist (physician) and Advanced Practice Providers (APPs -  Physician Assistants and Nurse Practitioners) who all work together to provide you with the care you need, when you need it. You will need a follow up appointment in 1 months.  You may see Robert Krasowski or another member of our [facility] Provider Team in [place]: Brian Munley, MD . Rajan Revankar, MD     

## 2019-04-30 ENCOUNTER — Telehealth: Payer: Self-pay | Admitting: Emergency Medicine

## 2019-04-30 NOTE — Telephone Encounter (Signed)
Left message for patient to return call regarding results  

## 2019-05-02 NOTE — Telephone Encounter (Signed)
Patient's mother called back and was informed of results for patient's heart monitor.

## 2019-05-02 NOTE — Telephone Encounter (Signed)
Left third message for patient's mother to return call to inform of monitor results. Will remove from work que.

## 2019-05-10 ENCOUNTER — Telehealth: Payer: Self-pay | Admitting: Cardiology

## 2019-05-10 ENCOUNTER — Ambulatory Visit (HOSPITAL_COMMUNITY)
Admission: RE | Admit: 2019-05-10 | Discharge: 2019-05-10 | Disposition: A | Discharge status: Home / Self Care | Payer: 59 | Source: Ambulatory Visit | Attending: Cardiology | Admitting: Cardiology

## 2019-05-10 ENCOUNTER — Other Ambulatory Visit: Payer: Self-pay

## 2019-05-10 DIAGNOSIS — R0789 Other chest pain: Secondary | ICD-10-CM

## 2019-05-10 DIAGNOSIS — R0609 Other forms of dyspnea: Secondary | ICD-10-CM

## 2019-05-10 DIAGNOSIS — R0602 Shortness of breath: Secondary | ICD-10-CM | POA: Insufficient documentation

## 2019-05-10 DIAGNOSIS — R55 Syncope and collapse: Secondary | ICD-10-CM

## 2019-05-10 LAB — CREATININE, SERUM
Creatinine, Ser: 1.07 mg/dL (ref 0.61–1.24)
GFR calc Af Amer: >60 mL/min (ref >60)
GFR calc non Af Amer: >60 mL/min (ref >60)

## 2019-05-10 MED ORDER — GADOBUTROL 1 MMOL/ML IV SOLN
10.0000 mL | Freq: Once | INTRAVENOUS | Status: AC | PRN
  Administered 2019-05-10: 15:00:00 10 mL via INTRAVENOUS

## 2019-05-10 NOTE — Telephone Encounter (Signed)
Telephone call to patient. Left message to return call. 

## 2019-05-10 NOTE — Telephone Encounter (Signed)
Mother returned call . Wants to know if patient's xanax will affect the MRI. Informed her no that it won't interfere with the MRI. No further questions.

## 2019-05-10 NOTE — Telephone Encounter (Signed)
Has questions about his MRI he's having this morning

## 2019-05-23 ENCOUNTER — Ambulatory Visit: Payer: 59 | Admitting: Cardiology

## 2019-05-28 ENCOUNTER — Encounter: Payer: Self-pay | Admitting: Cardiology

## 2019-05-28 ENCOUNTER — Ambulatory Visit (INDEPENDENT_AMBULATORY_CARE_PROVIDER_SITE_OTHER): Payer: 59 | Admitting: Cardiology

## 2019-05-28 ENCOUNTER — Other Ambulatory Visit: Payer: Self-pay

## 2019-05-28 VITALS — BP 130/80 | HR 90 | Ht 72.0 in | Wt 221.8 lb

## 2019-05-28 DIAGNOSIS — I517 Cardiomegaly: Secondary | ICD-10-CM

## 2019-05-28 DIAGNOSIS — R55 Syncope and collapse: Secondary | ICD-10-CM

## 2019-05-28 DIAGNOSIS — F419 Anxiety disorder, unspecified: Secondary | ICD-10-CM | POA: Diagnosis not present

## 2019-05-28 DIAGNOSIS — R0789 Other chest pain: Secondary | ICD-10-CM

## 2019-05-28 NOTE — Progress Notes (Signed)
Cardiology Office Note:    Date:  05/28/2019   ID:  Theodore Woods, DOB [birth year], MRN [id number]  PCP:  Gordan PaymentGrisso, Greg A., MD  Cardiologist:  Gypsy Balsamobert Krasowski, MD    Referring MD: Gordan PaymentGrisso, Greg A., MD   Chief Complaint  Patient presents with  . Follow-up  Doing better  History of Present Illness:    Theodore PuntCameron A Himebaugh is a 18 y.o. male with recurrent episode of syncope.from the description look like vasovagal however echocardiogram was done by primary care physician office which showed right ventricle enlargement or obviously suspicion for right ventricle arrhythmogenic dysplasia has been made.  He did have MRI of his heart done which showed normal right ventricle therefore I think we can drop the tissue.  He brought the issue of chest pain today he said walking sharp stabbing lasting for up to few minutes he is worried that this is something related to his heart I told him this is something that may require stress testing ideally on the treadmill but because of COVID-19 situation we cannot do it right now.  Past Medical History:  Diagnosis Date  . ADHD (attention deficit hyperactivity disorder)   . Allergic rhinitis   . Anxiety    Followed by Dr. Denman GeorgeGoff and Dr. Toni ArthursFuller  . Autism spectrum disorder   . GERD without esophagitis 03/08/2018  . Syncope 07/2018  . Weight loss     No past surgical history on file.  Current Medications: Current Meds  Medication Sig  . ALPRAZolam (XANAX) 0.5 MG tablet TAKE 1/2-1 TABLET AS NEEDED FOR ANXIETY/PANIC  . FLUoxetine (PROZAC) 10 MG tablet Take 10 mg by mouth daily.  Marland Kitchen. FLUoxetine (PROZAC) 20 MG capsule Take 20 mg by mouth daily.  Marland Kitchen. ibuprofen (ADVIL,MOTRIN) 200 MG tablet Take 600 mg by mouth every 6 (six) hours as needed. For pain.  Marland Kitchen. loratadine (CLARITIN) 10 MG tablet Take 10 mg by mouth daily as needed. For allergies.  . methylphenidate (CONCERTA) 54 MG CR tablet Take 54 mg by mouth every morning.  Marland Kitchen. omeprazole (PRILOSEC) 20 MG capsule Take 20  mg by mouth every other day.   . Probiotic Product (PROBIOTIC FORMULA PO) Take 1 tablet by mouth daily.     Allergies:   Patient has no known allergies.   Social History   Socioeconomic History  . Marital status: Single    Spouse name: Not on file  . Number of children: Not on file  . Years of education: Not on file  . Highest education level: Not on file  Occupational History  . Not on file  Social Needs  . Financial resource strain: Not on file  . Food insecurity    Worry: Not on file    Inability: Not on file  . Transportation needs    Medical: Not on file    Non-medical: Not on file  Tobacco Use  . Smoking status: Never Smoker  . Smokeless tobacco: Never Used  Substance and Sexual Activity  . Alcohol use: No    Frequency: Never  . Drug use: No  . Sexual activity: Not on file  Lifestyle  . Physical activity    Days per week: Not on file    Minutes per session: Not on file  . Stress: Not on file  Relationships  . Social Musicianconnections    Talks on phone: Not on file    Gets together: Not on file    Attends religious service: Not on file    Active member  of club or organization: Not on file    Attends meetings of clubs or organizations: Not on file    Relationship status: Not on file  Other Topics Concern  . Not on file  Social History Narrative   Pt lives in split level home with his parents, and his sister who is currently in [place] (home during [place])   Currently a senior in high school     Family History: The patient's family history includes Anxiety disorder in his mother; Asthma in his sister; Bladder Cancer in his maternal grandfather; Cancer in his paternal grandfather; Diabetes in his father; Heart disease in his maternal grandmother; Hyperlipidemia in his maternal grandmother; Hypertension in his father and maternal grandmother; Migraines in his mother. ROS:   Please see the history of present illness.    All 14 point review of systems negative except  as described per history of present illness  EKGs/Labs/Other Studies Reviewed:      Recent Labs: 05/10/2019: Creatinine, Ser 1.07  Recent Lipid Panel No results found for: CHOL, TRIG, HDL, CHOLHDL, VLDL, LDLCALC, LDLDIRECT  Physical Exam:    VS:  BP 130/80   Pulse 90   Ht 6' (1.829 m)   Wt 221 lb 12.8 oz (100.6 kg)   SpO2 98%   BMI 30.08 kg/m     Wt Readings from Last 3 Encounters:  05/28/19 221 lb 12.8 oz (100.6 kg)  04/20/19 216 lb (98 kg) (96 %, Z= 1.76)*  03/22/19 200 lb (90.7 kg) (92 %, Z= 1.40)*   * Growth percentiles are based on CDC (Boys, 2-20 Years) data.     GEN:  Well nourished, well developed in no acute distress HEENT: Normal NECK: No JVD; No carotid bruits LYMPHATICS: No lymphadenopathy CARDIAC: RRR, no murmurs, no rubs, no gallops RESPIRATORY:  Clear to auscultation without rales, wheezing or rhonchi  ABDOMEN: Soft, non-tender, non-distended MUSCULOSKELETAL:  No edema; No deformity  SKIN: Warm and dry LOWER EXTREMITIES: no swelling NEUROLOGIC:  Alert and oriented x 3 PSYCHIATRIC:  Normal affect   ASSESSMENT:    1. Vasovagal syncope   2. Right ventricular enlargement   3. Anxiety   4. Atypical chest pain    PLAN:    In order of problems listed above:  1. Vasovagal syncope again I gave him instruction about what vasovagal syncope means we talk about increase fluid intake as well as salt.  He did not have an episode of syncope since have seen him last 2. Right ventricle enlargement normal by MRI we can scratch this diagnosis. 3. Anxiety not at present. 4. Atypical chest pain in the future we will do stress test   Medication Adjustments/Labs and Tests Ordered: Current medicines are reviewed at length with the patient today.  Concerns regarding medicines are outlined above.  No orders of the defined types were placed in this encounter.  Medication changes: No orders of the defined types were placed in this encounter.   Signed, Park Liter, MD, [facility] 05/28/2019 4:53 PM    [place]

## 2019-05-28 NOTE — Patient Instructions (Signed)
Medication Instructions:  Your physician recommends that you continue on your current medications as directed. Please refer to the Current Medication list given to you today.  If you need a refill on your cardiac medications before your next appointment, please call your pharmacy.   Lab work: None  If you have labs (blood work) drawn today and your tests are completely normal, you will receive your results only by: . MyChart Message (if you have MyChart) OR . A paper copy in the mail If you have any lab test that is abnormal or we need to change your treatment, we will call you to review the results.  Testing/Procedures: None  Follow-Up: At [facility], you and your health needs are our priority.  As part of our continuing mission to provide you with exceptional heart care, we have created designated Provider Care Teams.  These Care Teams include your primary Cardiologist (physician) and Advanced Practice Providers (APPs -  Physician Assistants and Nurse Practitioners) who all work together to provide you with the care you need, when you need it. You will need a follow up appointment in 6 weeks.   

## 2019-07-13 ENCOUNTER — Telehealth: Payer: Self-pay | Admitting: Cardiology

## 2019-07-13 NOTE — Telephone Encounter (Signed)
Dr. Raliegh Ip had mentioned having pt do stress test. If he wants done, can this be put in and scheduled prior to October 1 due to insurance issues? If not needed, I can schedule f/up with Dr. Raliegh Ip. Please advise.

## 2019-07-18 NOTE — Telephone Encounter (Signed)
Left message for patients mother to return call.

## 2019-07-18 NOTE — Telephone Encounter (Signed)
How is  He doing? Does he still have a pain?  If he still has a pain, need to be schedule for plin treadmil exercise stress test at [facility] [address] if not much I would wait until we start doing stress test at our office

## 2019-07-19 NOTE — Telephone Encounter (Signed)
Patient's mother called. She reports the patient still has pain but there is nothing that correlates with this. So she wants to know if a stress test is needed because his pain doesn't necessarily come from exertion. Will consult with Dr. Agustin Cree.

## 2019-07-19 NOTE — Telephone Encounter (Signed)
Left message for patients mother to return call.

## 2019-07-20 NOTE — Telephone Encounter (Signed)
Called mother informed her Dr. Agustin Cree does not advised any further testing at this time she verbally understood. Scheduled follow up appointment while on the phone with her it was overdue. She verbally understands. No further questions.

## 2019-07-31 ENCOUNTER — Other Ambulatory Visit: Payer: Self-pay | Admitting: Neurology

## 2019-07-31 NOTE — Telephone Encounter (Signed)
Pt was last seen 03/2019. At that time you wanted pt to hold off on Topiramate and limit OTC meds to reduce rebound HA's.  Confirmed with pts mom that pt has a HA about every 4 days. He only takes 3 advil at that time. Mom states that the son is 48ft and 215lbs.  Mom states that they are still doing test with cardiology. Next is some time of special echo per mom.  Dr. Delice Lesch, are you ok with Topiramate prescription?

## 2019-08-15 ENCOUNTER — Ambulatory Visit: Payer: 59 | Admitting: Cardiology

## 2019-08-16 ENCOUNTER — Ambulatory Visit: Payer: 59 | Admitting: Cardiology

## 2019-08-30 ENCOUNTER — Ambulatory Visit: Payer: 59 | Admitting: Cardiology

## 2019-09-14 ENCOUNTER — Other Ambulatory Visit: Payer: Self-pay

## 2019-09-14 ENCOUNTER — Ambulatory Visit (INDEPENDENT_AMBULATORY_CARE_PROVIDER_SITE_OTHER): Payer: 59 | Admitting: Cardiology

## 2019-09-14 ENCOUNTER — Encounter: Payer: Self-pay | Admitting: Cardiology

## 2019-09-14 VITALS — BP 130/88 | HR 91 | Ht 72.0 in | Wt 227.2 lb

## 2019-09-14 DIAGNOSIS — R55 Syncope and collapse: Secondary | ICD-10-CM

## 2019-09-14 DIAGNOSIS — R0789 Other chest pain: Secondary | ICD-10-CM

## 2019-09-14 DIAGNOSIS — F419 Anxiety disorder, unspecified: Secondary | ICD-10-CM

## 2019-09-14 NOTE — Progress Notes (Signed)
Cardiology Office Note:    Date:  09/14/2019   ID:  Theodore Woods, DOB 12/31/2000, MRN [id number]  PCP:  Raina Mina., MD  Cardiologist:  Jenne Campus, MD    Referring MD: Raina Mina., MD   No chief complaint on file. Doing fair  History of Present Illness:    Theodore Woods is a 18 y.o. male with vasovagal syncope, also attention deficit hyperactive disorder, anxiety, atypical chest pain.  Comes today to my office for follow-up.  Denies having any syncope since have seen him last time he is drinks plenty of fluid.  He feels few times like he is going to pass out he was able to stop doing what he doing sit down a few minutes later he was fine.  Overall seems to be doing well.  Still complain of having atypical chest pain he does have difficult time to describe sensation however overall it does not happen with exercise.  Last for up to half a minute quite severe but overall he seems to be doing well with this there is no worsening of the symptomatology.  Again his ability to exercise is good he is able to walk climb stairs with no problem and does not have exertional chest pain.  Past Medical History:  Diagnosis Date  . ADHD (attention deficit hyperactivity disorder)   . Allergic rhinitis   . Anxiety    Followed by Dr. Mikey Bussing and Dr. Toy Cookey  . Autism spectrum disorder   . GERD without esophagitis 03/08/2018  . Syncope 07/2018  . Weight loss     No past surgical history on file.  Current Medications: No outpatient medications have been marked as taking for the 09/14/19 encounter (Office Visit) with Park Liter, MD.     Allergies:   Patient has no known allergies.   Social History   Socioeconomic History  . Marital status: Single    Spouse name: Not on file  . Number of children: Not on file  . Years of education: Not on file  . Highest education level: Not on file  Occupational History  . Not on file  Social Needs  . Financial resource strain: Not  on file  . Food insecurity    Worry: Not on file    Inability: Not on file  . Transportation needs    Medical: Not on file    Non-medical: Not on file  Tobacco Use  . Smoking status: Never Smoker  . Smokeless tobacco: Never Used  Substance and Sexual Activity  . Alcohol use: No    Frequency: Never  . Drug use: No  . Sexual activity: Not on file  Lifestyle  . Physical activity    Days per week: Not on file    Minutes per session: Not on file  . Stress: Not on file  Relationships  . Social Herbalist on phone: Not on file    Gets together: Not on file    Attends religious service: Not on file    Active member of club or organization: Not on file    Attends meetings of clubs or organizations: Not on file    Relationship status: Not on file  Other Topics Concern  . Not on file  Social History Narrative   Pt lives in split level home with his parents, and his sister who is currently in [place] (home during [place])   Currently a senior in high school     Family History:  The patient's family history includes Anxiety disorder in his mother; Asthma in his sister; Bladder Cancer in his maternal grandfather; Cancer in his paternal grandfather; Diabetes in his father; Heart disease in his maternal grandmother; Hyperlipidemia in his maternal grandmother; Hypertension in his father and maternal grandmother; Migraines in his mother. ROS:   Please see the history of present illness.    All 14 point review of systems negative except as described per history of present illness  EKGs/Labs/Other Studies Reviewed:      Recent Labs: 05/10/2019: Creatinine, Ser 1.07  Recent Lipid Panel No results found for: CHOL, TRIG, HDL, CHOLHDL, VLDL, LDLCALC, LDLDIRECT  Physical Exam:    VS:  BP 130/88   Pulse 91   Ht 6' (1.829 m)   Wt 227 lb 3.2 oz (103.1 kg)   SpO2 98%   BMI 30.81 kg/m     Wt Readings from Last 3 Encounters:  09/14/19 227 lb 3.2 oz (103.1 kg)  05/28/19 221 lb  12.8 oz (100.6 kg)  04/20/19 216 lb (98 kg) (96 %, Z= 1.76)*   * Growth percentiles are based on CDC (Boys, 2-20 Years) data.     GEN:  Well nourished, well developed in no acute distress HEENT: Normal NECK: No JVD; No carotid bruits LYMPHATICS: No lymphadenopathy CARDIAC: RRR, no murmurs, no rubs, no gallops RESPIRATORY:  Clear to auscultation without rales, wheezing or rhonchi  ABDOMEN: Soft, non-tender, non-distended MUSCULOSKELETAL:  No edema; No deformity  SKIN: Warm and dry LOWER EXTREMITIES: no swelling NEUROLOGIC:  Alert and oriented x 3 PSYCHIATRIC:  Normal affect   ASSESSMENT:    1. Vasovagal syncope   2. Anxiety   3. Atypical chest pain    PLAN:    In order of problems listed above:  1. Vasovagal syncope doing well from that point review with I reemphasized need to drink plenty of fluids 2. Anxiety stable followed by internal medicine team and psychiatrist 3. Atypical chest pain.  We had a long discussion again about what to do with the situation I described to them option of stress testing with medications versus calcium scoring.  They prefer to wait until situation with coronavirus will improve and will be able to put him on the treadmill.   Medication Adjustments/Labs and Tests Ordered: Current medicines are reviewed at length with the patient today.  Concerns regarding medicines are outlined above.  No orders of the defined types were placed in this encounter.  Medication changes: No orders of the defined types were placed in this encounter.   Signed, Georgeanna Lea, MD, [facility] 09/14/2019 2:31 PM    [facility]

## 2019-09-14 NOTE — Patient Instructions (Signed)
Medication Instructions:  Your physician recommends that you continue on your current medications as directed. Please refer to the Current Medication list given to you today.  *If you need a refill on your cardiac medications before your next appointment, please call your pharmacy*  Lab Work: None.  If you have labs (blood work) drawn today and your tests are completely normal, you will receive your results only by: Marland Kitchen MyChart Message (if you have MyChart) OR . A paper copy in the mail If you have any lab test that is abnormal or we need to change your treatment, we will call you to review the results.  Testing/Procedures: None.   Follow-Up: At [facility], you and your health needs are our priority.  As part of our continuing mission to provide you with exceptional heart care, we have created designated Provider Care Teams.  These Care Teams include your primary Cardiologist (physician) and Advanced Practice Providers (APPs -  Physician Assistants and Nurse Practitioners) who all work together to provide you with the care you need, when you need it.  Your next appointment:   4 month   The format for your next appointment:   In Person  Provider:     Other Instructions

## 2019-10-16 ENCOUNTER — Other Ambulatory Visit: Payer: Self-pay

## 2019-10-16 ENCOUNTER — Telehealth (INDEPENDENT_AMBULATORY_CARE_PROVIDER_SITE_OTHER): Payer: 59 | Admitting: Neurology

## 2019-10-16 ENCOUNTER — Encounter: Payer: Self-pay | Admitting: Neurology

## 2019-10-16 VITALS — Ht 72.0 in | Wt 215.0 lb

## 2019-10-16 DIAGNOSIS — R519 Headache, unspecified: Secondary | ICD-10-CM | POA: Diagnosis not present

## 2019-10-16 DIAGNOSIS — R55 Syncope and collapse: Secondary | ICD-10-CM

## 2019-10-16 DIAGNOSIS — G43109 Migraine with aura, not intractable, without status migrainosus: Secondary | ICD-10-CM

## 2019-10-16 NOTE — Progress Notes (Signed)
Virtual Visit via Video Note The purpose of this virtual visit is to provide medical care while limiting exposure to the novel coronavirus.    Consent was obtained for video visit:  Yes.   Answered questions that patient had about telehealth interaction:  Yes.   I discussed the limitations, risks, security and privacy concerns of performing an evaluation and management service by telemedicine. I also discussed with the patient that there may be a patient responsible charge related to this service. The patient expressed understanding and agreed to proceed.  Pt location: Home Physician Location: office Name of referring provider:  Raina Mina., MD I connected with Theodore Woods at patients initiation/request on 10/16/2019 at  8:30 AM EST by video enabled telemedicine application and verified that I am speaking with the correct person using two identifiers. Pt MRN:  [id number] Pt DOB:  Sep 24, 2001 Video Participants:  Theodore Woods;  Theodore Woods (mother)   History of Present Illness:  The patient was seen as a virtual video visit on 10/16/2019. He was last seen 7 months ago for recurrent episodes of loss of consciousness and migraines. MRI brain and EEG unremarkable. He had an echocardiogram in 03/2019 which showed EF 55-60%, moderately dilated right ventricle, mildly dilated right atrium. He has been seeing cardiology and has further tests planned (stress test). He has not had any syncopal episodes since December 2018, however continues to have presyncopal spells where he feels "like the black is coming" but not passing out. He would become pale and need to sit down. He reports they usually occur when he is outside or overworking himself/stressed out, however his mother feels that there is not clear pattern/triggers. He usually has a headache after. He also has headaches 2-3 times a week, some are milder, however the more intense headaches are associated with nausea. He takes prn Advil.  He was given a prescription for Topiramate for migraine prophylaxis, however this was not started to reduce variables as he continues with cardiac workup. He denies any vision changes, focal numbness/tingling/weakness, no falls. Sleep is mediocre, he has difficulty with sleep initiation and maintenance. He snores on occasion. No daytime drowsiness.   History on Initial Assessment 01/17/2018: This is an 18 year old right-handed man with a history of anxiety, ADHD, presenting for evaluation of recurrent episodes of loss of consciousness. He initially started having them when he was at band camp in the summer and was attributed to dehydration. He would start feeling really hot and lightheaded, then pass out. His mother had instructions for co-parents on what to do for him, co-parents have described him as being pale when they come to him. After the syncopal events, he would have a headache with right-sided throbbing, no nausea/vomiting. In December, he had an episode that occurred outside of band. He was standing then started feeling weird like something was wrong, then suddenly had a horrible headache and woke up on the ground. He states this headache was different with a lot more pain, nausea, and headache lasted for several hours after, with sensitivity to light and sound. His vision felt blurred. Another time he was inside an [employer], operational for a concert. He got up and started having a bad headache and feeling lightheaded. He sat down and felt like he could not move well. He told his teacher and sat inside a room and called his mother. His mother was upset that he was left alone, she "had to keep him from passing out," screaming at  him on the phone because he was not making sense trying to communicate with her. She kept screaming on the phone until he finally said "yeah" and she found him half-slouched on the wall sitting on the floor, pale and clammy with zero energy. He had not been doing anything  strenuous that time. The last episode occurred in January, he was driving when he felt lightheaded and his head really hurt, similar to the headache in December. His father switched seats with him, and as he moved to the passenger seat he nearly passed out. They denied any true loss of consciousness this time. The headache again lasted for a few hours. He has not had any further syncopal episodes but has complained of feeling lightheaded. He usually has to lay down and go to sleep, Advil helps some with the headaches.   He and his mother report that he was having daily headaches in January and was "eating Advil." He started Claritin with a noticeable decrease in headaches last month. He has not needed Advil in a couple of weeks. He still has milder headaches around twice a week on the right side, but has talked to his mother about the headaches being all over as well. He has occasional high pitched tinnitus. He has occasional neck pain. He was previously on Lexapro then switched to Prozac last month for anxiety. He has not taken Xanax yet. He takes Concerta daily for ADHD. His mother denies any staring spells. He denies any olfactory/gustatory hallucinations, deja vu, rising epigastric sensation, focal numbness/tingling/weakness, myoclonic jerks. No diplopia, dysarthria/dysphagia, bowel/bladder dysfunction. Sleep is okay, he usually gest 8 hours of sleep. There is a family history of migraines in his mother and maternal grandmother.  He had a normal birth and early development.  There is no history of febrile convulsions, CNS infections such as meningitis/encephalitis, significant traumatic brain injury, neurosurgical procedures, or family history of seizures    Observations/Objective:   Patient is awake, alert, oriented x 3. No aphasia or dysarthria. Intact fluency and comprehension. Remote and recent memory intact. Able to name and repeat. Cranial nerves: Extraocular movements intact with no nystagmus. No  facial asymmetry. Motor: moves all extremities symmetrically, at least anti-gravity x 4. No incoordination on finger to nose testing. Gait: narrow-based and steady, able to tandem walk adequately. Negative Romberg test.   Assessment and Plan:   This is a 18 yo RH man with a history of anxiety, ADHD, with recurrent episodes of near syncope/syncope with associated significant headaches. Last full syncopal episode was December 2018, he continues to mostly have near syncopal episodes and headaches. MRI brain and EEG unremarkable. His echocardiogram was abnormal, he is working with Cardiology with further testing planned. We had discussed starting Topiramate for migraine prophylaxis, but have agreed to hold off to reduce variables as he continues to work with Cardiology. They will plan to start Topiramate once done with cardiac testing. His mother will let us know if they will need an updated prescription. He knows to minimize rescue medication to 2-3 times a week to avoid rebound headaches. He will follow-up in 6 months and knows to call for any changes.  Thank you for allowing me to participate in his care.  Please do not hesitate to call for any questions or concerns.   Patrcia Dolly, M.D.

## 2020-01-11 ENCOUNTER — Ambulatory Visit: Payer: 59 | Admitting: Cardiology

## 2020-01-29 ENCOUNTER — Ambulatory Visit: Payer: 59 | Admitting: Cardiology

## 2020-04-26 IMAGING — MR MR CARDIA MORPHOLOGY WITHOUT AND WITH CONTRAST
40 of 42 series · 41 of 43 positions shown · IV contrast (gadavist)
Comparison: none

CLINICAL DATA: 18-year-old male with recurrent syncope and dilated
right ventricle on echocardiogram. Evaluate for ARVC.

EXAM:
CARDIAC MRI
TECHNIQUE: The patient was scanned on a 1.5 Tesla GE magnet. A dedicated
cardiac coil was used. Functional imaging was done using Fiesta
sequences. [DATE], and 4 chamber views were done to assess for RWMA's.
Modified Ylerick rule using a short axis stack was used to
calculate an ejection fraction on a dedicated work station using
Circle software. The patient received 10 cc of Gadavist. After 10
minutes inversion recovery sequences were used to assess for
infiltration and scar tissue.
CONTRAST:  10 cc  of Gadavist

[Series 6: bSSFP · oblique · 8.0mm · 1.52mm/px · 2 of 450 slices shown (1 of 5)]
[im 1/450]
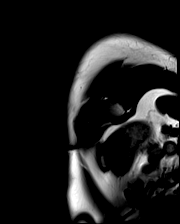
[im 450/450]
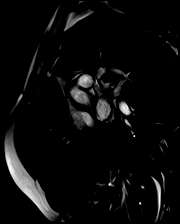

[Series 7: bSSFP · axial · 6.0mm · 1.41mm/px · 1 of 25 slices shown (2 of 5)]
[im 1/25]
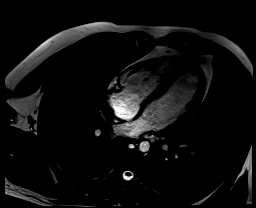

[Series 8: bSSFP · oblique · 6.0mm · 1.41mm/px · 1 of 25 slices shown (3 of 5)]
[im 1/25]
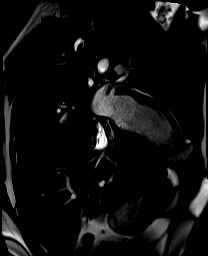

[Series 9: bSSFP · sagittal · 6.0mm · 1.41mm/px · 1 of 25 slices shown (4 of 5)]
[im 1/25]
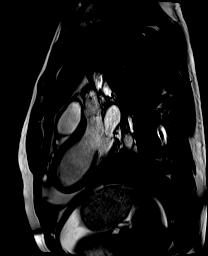

[Series 10: 3ir str axial · axial · 6.0mm · 1.35mm/px · 1 of 15 slices shown]
[im 1/15]
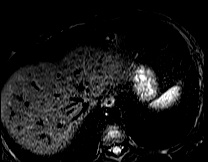

[Series 11: dir str axial · axial · 6.0mm · 1.03mm/px · 1 of 15 slices shown]
[im 1/15]
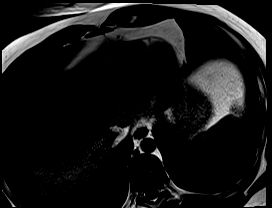

[Series 12: bSSFP · axial · 6.0mm · 1.61mm/px · 1 of 225 slices shown (5 of 5)]
[im 1/225]
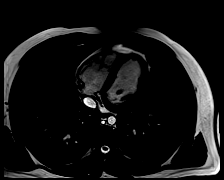

[Series 14: t1_tse_db sag · sagittal · 5.0mm · 0.88mm/px · 1 of 15 slices shown]
[im 1/15]
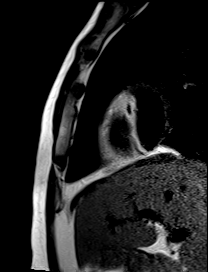

[Series 15: t1_tse_db sag free · sagittal · 5.0mm · 1.16mm/px · 1 of 20 slices shown]
[im 1/20]
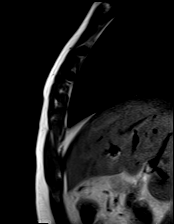

[Series 16: cine_trufi_cs_rt_axial free breathing · axial · 6.0mm · 1.35mm/px · 1 of 34 slices shown (1 of 19)]
[im 1/34]
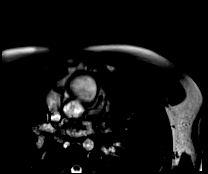

[Series 17: cine_trufi_cs_rt_axial free breathing · axial · 6.0mm · 1.35mm/px · 1 of 34 slices shown (2 of 19)]
[im 1/34]
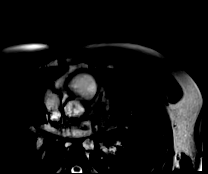

[Series 18: cine_trufi_cs_rt_axial free breathing · axial · 6.0mm · 1.35mm/px · 1 of 34 slices shown (3 of 19)]
[im 1/34]
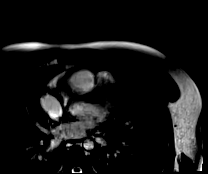

[Series 19: cine_trufi_cs_rt_axial free breathing · axial · 6.0mm · 1.35mm/px · 1 of 34 slices shown (4 of 19)]
[im 1/34]
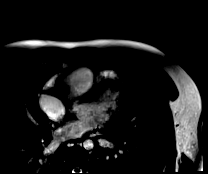

[Series 20: cine_trufi_cs_rt_axial free breathing · axial · 6.0mm · 1.35mm/px · 1 of 34 slices shown (5 of 19)]
[im 1/34]
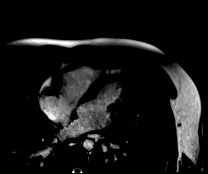

[Series 21: cine_trufi_cs_rt_axial free breathing · axial · 6.0mm · 1.35mm/px · 1 of 34 slices shown (6 of 19)]
[im 1/34]
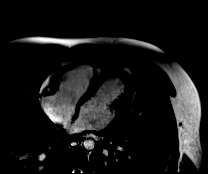

[Series 22: cine_trufi_cs_rt_axial free breathing · axial · 6.0mm · 1.35mm/px · 1 of 34 slices shown (7 of 19)]
[im 1/34]
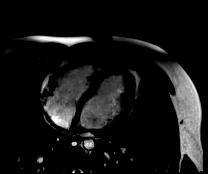

[Series 23: cine_trufi_cs_rt_axial free breathing · axial · 6.0mm · 1.35mm/px · 1 of 34 slices shown (8 of 19)]
[im 1/34]
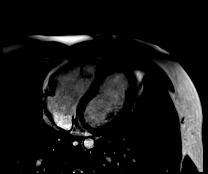

[Series 24: cine_trufi_cs_rt_axial free breathing · axial · 6.0mm · 1.35mm/px · 1 of 34 slices shown (9 of 19)]
[im 1/34]
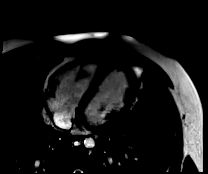

[Series 25: cine_trufi_cs_rt_axial free breathing · axial · 6.0mm · 1.35mm/px · 1 of 34 slices shown (10 of 19)]
[im 1/34]
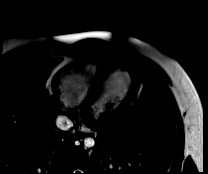

[Series 26: cine_trufi_cs_rt_axial free breathing · axial · 6.0mm · 1.35mm/px · 1 of 34 slices shown (11 of 19)]
[im 1/34]
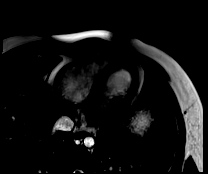

[Series 27: cine_trufi_cs_rt_axial free breathing · axial · 6.0mm · 1.35mm/px · 1 of 34 slices shown (12 of 19)]
[im 1/34]
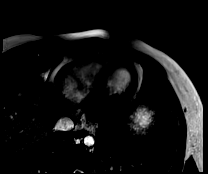

[Series 28: cine_trufi_cs_rt_axial free breathing · axial · 6.0mm · 1.35mm/px · 1 of 34 slices shown (13 of 19)]
[im 1/34]
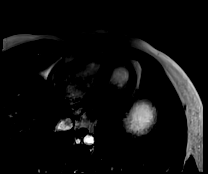

[Series 30: cine_trufi_cs_rt_axial free breathing · axial · 6.0mm · 1.35mm/px · 1 of 34 slices shown (14 of 19)]
[im 1/34]
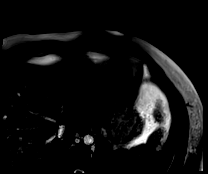

[Series 31: cine_trufi_cs_rt_axial free breathing · axial · 6.0mm · 1.35mm/px · 1 of 34 slices shown (15 of 19)]
[im 1/34]
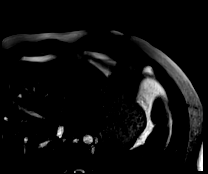

[Series 32: cine_trufi_cs_rt_axial free breathing · axial · 6.0mm · 1.35mm/px · 1 of 34 slices shown (16 of 19)]
[im 1/34]
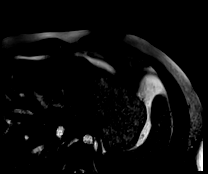

[Series 33: cine_trufi_cs_rt_axial free breathing · axial · 6.0mm · 1.35mm/px · 1 of 34 slices shown (17 of 19)]
[im 1/34]
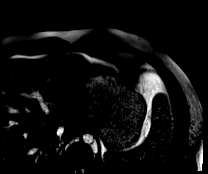

[Series 34: cine_trufi_cs_rt_axial free breathing · axial · 6.0mm · 1.35mm/px · 1 of 34 slices shown (18 of 19)]
[im 1/34]
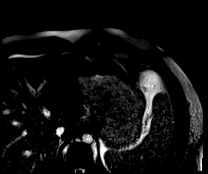

[Series 35: cine_trufi_cs_rt_axial free breathing · axial · 6.0mm · 1.35mm/px · 1 of 34 slices shown (19 of 19)]
[im 1/34]
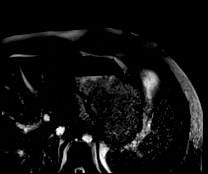

[Series 37: lge_single shot sa · oblique · 8.0mm · 1.77mm/px · 1 of 15 slices shown (1 of 2)]
[im 1/15]
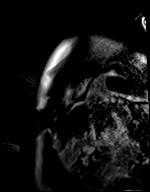

[Series 38: lge_single shot sa · oblique · 8.0mm · 1.77mm/px · 1 of 15 slices shown (2 of 2)]
[im 1/15]
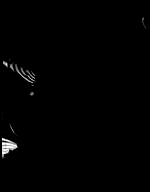

[Series 39: lge_single shot radial_mag · axial · 6.0mm · 1.77mm/px · 1 of 1 slices shown (1 of 2)]
[im 1/1]
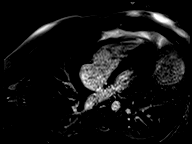

[Series 40: lge_single shot radial_psir · axial · 6.0mm · 1.77mm/px · 1 of 1 slices shown (1 of 2)]
[im 1/1]
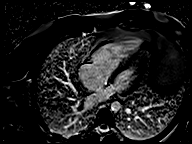

[Series 46: lge_single shot radial_mag · axial · 6.0mm · 1.77mm/px · 1 of 1 slices shown (2 of 2)]
[im 1/1]
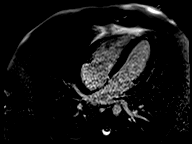

[Series 47: lge_single shot radial_psir · axial · 6.0mm · 1.77mm/px · 1 of 1 slices shown (2 of 2)]
[im 1/1]
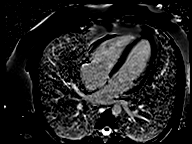

[Series 52: lge short axis_mag · oblique · 8.0mm · 1.61mm/px · 1 of 15 slices shown]
[im 1/15]
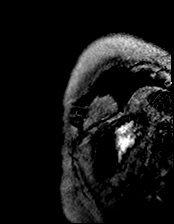

[Series 53: lge short axis_psir · oblique · 8.0mm · 1.61mm/px · 1 of 15 slices shown]
[im 1/15]
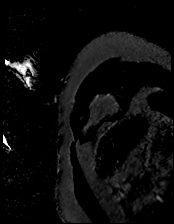

[Series 54: lge radial ((date)ch)_mag · axial · 6.0mm · 1.61mm/px · 1 of 1 slices shown (1 of 2)]
[im 1/1]
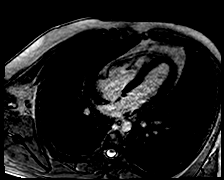

[Series 55: lge radial ((date)ch)_psir · axial · 6.0mm · 1.61mm/px · 1 of 1 slices shown (1 of 2)]
[im 1/1]
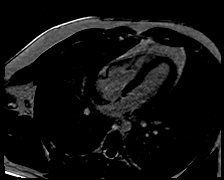

[Series 60: lge radial ((date)ch)_mag · axial · 6.0mm · 1.61mm/px · 1 of 1 slices shown (2 of 2)]
[im 1/1]
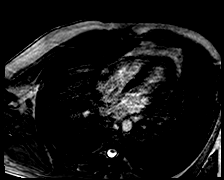

[Series 61: lge radial ((date)ch)_psir · axial · 6.0mm · 1.61mm/px · 1 of 1 slices shown (2 of 2)]
[im 1/1]
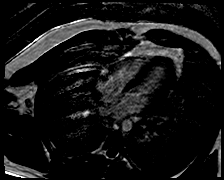

[41 of 43 positions shown; findings below may reference images not displayed]

FINDINGS: 1. Normal left ventricular size, thickness and systolic function
(LVEF = 61%). There are no regional wall motion abnormalities. There
is no late gadolinium enhancement in the left ventricular
myocardium.

LVEDD: 48 mm

LVESD: 34 mm

LVEDV: 152 ml

LVESV: 59 ml

SV: 93 ml

CO: 6.4 L/min

Myocardial mass: 122 g

2. Normal right ventricular size, thickness and systolic function
(LVEF = 47%). There are no regional wall motion abnormalities.

3.  Normal left and right atrial size.

4. Normal size of the aortic root, ascending aorta and pulmonary
artery.

5.  Trivial tricuspid regurgitation.

6.  Normal pericardium.  No pericardial effusion.
IMPRESSION: 1. Normal left ventricular size, thickness and systolic function
(LVEF = 61%). There are no regional wall motion abnormalities. There
is no late gadolinium enhancement in the left ventricular
myocardium.

2. Normal right ventricular size, thickness and systolic function
(LVEF = 47%). There are no regional wall motion abnormalities.

3. Normal left and right atrial size.

4. Normal size of the aortic root, ascending aorta and pulmonary
artery.

5. Trivial tricuspid regurgitation.

6. Normal pericardium.  No pericardial effusion.

There is no evidence for arrhythmogenic right ventricular
cardiomyopathy.

## 2020-05-19 ENCOUNTER — Encounter: Payer: Self-pay | Admitting: Neurology

## 2020-05-19 ENCOUNTER — Telehealth (INDEPENDENT_AMBULATORY_CARE_PROVIDER_SITE_OTHER): Payer: 59 | Admitting: Neurology

## 2020-05-19 ENCOUNTER — Other Ambulatory Visit: Payer: Self-pay

## 2020-05-19 VITALS — Ht 72.0 in | Wt 200.0 lb

## 2020-05-19 DIAGNOSIS — R55 Syncope and collapse: Secondary | ICD-10-CM

## 2020-05-19 DIAGNOSIS — G43109 Migraine with aura, not intractable, without status migrainosus: Secondary | ICD-10-CM

## 2020-05-19 NOTE — Progress Notes (Signed)
Virtual Visit via Video Note The purpose of this virtual visit is to provide medical care while limiting exposure to the novel coronavirus.    Consent was obtained for video visit:  Yes.   Answered questions that patient had about telehealth interaction:  Yes.   I discussed the limitations, risks, security and privacy concerns of performing an evaluation and management service by telemedicine. I also discussed with the patient that there may be a patient responsible charge related to this service. The patient expressed understanding and agreed to proceed.  Pt location: Home Physician Location: office Name of referring provider:  Gordan Payment., MD I connected with [name] at patients initiation/request on 05/19/2020 at  8:30 AM EDT by video enabled telemedicine application and verified that I am speaking with the correct person using two identifiers. Pt MRN:  [id number] Pt DOB:  04-15-2001 Video Participants:  [name];  [name] (mother)   History of Present Illness:  The patient was seen as a virtual video visit on 05/19/2020. He was last seen 7 months ago for recurrent episodes of loss of consciousness and migraines. MRI brain and EEG unremarkable. Echocardiogram in 03/2019 showed EF 55-60%, moderately dilated right ventricle, mildly dilated right atrium. He missed his last cardiology appointment and is scheduled to see them soon. He has not had any syncopal episodes since December 2018. He has had a couple of episodes of near syncope but did not pass out. He would feel lightheaded, pale, and need to sit down. His mother notes a glazed look, but he is able to respond to questions. He continues to report around two migraines a week, he takes Advil and lays in bed. No clear triggers except stress at times. He has a prescription for Topiramate but has been holding off pending cardiology evaluation. He denies any vision changes, focal numbness/tingling/weakness, no falls.  He reports sleep is good, however his mother has noticed he does not sleep as well.    History on Initial Assessment 01/17/2018: This is an 19 year old right-handed man with a history of anxiety, ADHD, presenting for evaluation of recurrent episodes of loss of consciousness. He initially started having them when he was at band camp in the summer and was attributed to dehydration. He would start feeling really hot and lightheaded, then pass out. His mother had instructions for co-parents on what to do for him, co-parents have described him as being pale when they come to him. After the syncopal events, he would have a headache with right-sided throbbing, no nausea/vomiting. In December, he had an episode that occurred outside of band. He was standing then started feeling weird like something was wrong, then suddenly had a horrible headache and woke up on the ground. He states this headache was different with a lot more pain, nausea, and headache lasted for several hours after, with sensitivity to light and sound. His vision felt blurred. Another time he was inside an [employer] for a concert. He got up and started having a bad headache and feeling lightheaded. He sat down and felt like he could not move well. He told his teacher and sat inside a room and called his mother. His mother was upset that he was left alone, she "had to keep him from passing out," screaming at him on the phone because he was not making sense trying to communicate with her. She kept screaming on the phone until he finally said "yeah" and she found him half-slouched on  the wall sitting on the floor, pale and clammy with zero energy. He had not been doing anything strenuous that time. The last episode occurred in January, he was driving when he felt lightheaded and his head really hurt, similar to the headache in December. His father switched seats with him, and as he moved to the passenger seat he nearly passed out. They denied any  true loss of consciousness this time. The headache again lasted for a few hours. He has not had any further syncopal episodes but has complained of feeling lightheaded. He usually has to lay down and go to sleep, Advil helps some with the headaches.   He and his mother report that he was having daily headaches in January and was "eating Advil." He started Claritin with a noticeable decrease in headaches last month. He has not needed Advil in a couple of weeks. He still has milder headaches around twice a week on the right side, but has talked to his mother about the headaches being all over as well. He has occasional high pitched tinnitus. He has occasional neck pain. He was previously on Lexapro then switched to Prozac last month for anxiety. He has not taken Xanax yet. He takes Concerta daily for ADHD. His mother denies any staring spells. He denies any olfactory/gustatory hallucinations, deja vu, rising epigastric sensation, focal numbness/tingling/weakness, myoclonic jerks. No diplopia, dysarthria/dysphagia, bowel/bladder dysfunction. Sleep is okay, he usually gest 8 hours of sleep. There is a family history of migraines in his mother and maternal grandmother.  He had a normal birth and early development.  There is no history of febrile convulsions, CNS infections such as meningitis/encephalitis, significant traumatic brain injury, neurosurgical procedures, or family history of seizures    Current Outpatient Medications on File Prior to Visit  Medication Sig Dispense Refill  . ALPRAZolam (XANAX) 0.5 MG tablet TAKE 1/2-1 TABLET AS NEEDED FOR ANXIETY/PANIC  2  . FLUoxetine (PROZAC) 10 MG tablet Take 10 mg by mouth daily.  5  . FLUoxetine (PROZAC) 20 MG capsule Take 20 mg by mouth daily.  12  . ibuprofen (ADVIL,MOTRIN) 200 MG tablet Take 600 mg by mouth every 6 (six) hours as needed. For pain.    Marland Kitchen loratadine (CLARITIN) 10 MG tablet Take 10 mg by mouth daily as needed. For allergies.    .  methylphenidate (CONCERTA) 54 MG CR tablet Take 54 mg by mouth every morning.    Marland Kitchen omeprazole (PRILOSEC) 20 MG capsule Take 20 mg by mouth every other day.   3  . Probiotic Product (PROBIOTIC FORMULA PO) Take 1 tablet by mouth daily.     No current facility-administered medications on file prior to visit.     Observations/Objective:   Vitals:   05/19/20 0807  Weight: 200 lb (90.7 kg)  Height: 6' (1.829 m)   GEN:  The patient appears stated age and is in NAD.  Neurological examination: Patient is awake, alert, oriented x 3. No aphasia or dysarthria. Intact fluency and comprehension. Remote and recent memory intact. Cranial nerves: Extraocular movements intact with no nystagmus. No facial asymmetry. Motor: moves all extremities symmetrically, at least anti-gravity x 4. No incoordination on finger to nose testing. Gait: narrow-based and steady, able to tandem walk adequately. Negative Romberg test.   Assessment and Plan:   This is a 19 yo RH man with a history of anxiety, ADHD, with recurrent episodes of near syncope/syncope with associated significant headaches. He has not had any syncopal episodes since December 2018 but  continues to have near syncopal episodes and migraines. MRI brain and EEG unremarkable. Echocardiogram was abnormal, he has an upcoming appointment with Cardiology. We again discussed Topiramate for migraine prophylaxis, he would like to continue holding off until cardiology evaluation is complete. He knows to minimize rescue medications to 2-3 a week to avoid rebound headaches. Follow-up in 6-7 months, they know to call for any changes.    Follow Up Instructions:   -I discussed the assessment and treatment plan with the patient. The patient was provided an opportunity to ask questions and all were answered. The patient agreed with the plan and demonstrated an understanding of the instructions.   The patient was advised to call back or seek an in-person evaluation if the  symptoms worsen or if the condition fails to improve as anticipated.   Van Clines, MD

## 2020-09-06 ENCOUNTER — Other Ambulatory Visit: Payer: Self-pay | Admitting: Neurology

## 2020-12-23 ENCOUNTER — Ambulatory Visit: Payer: 59 | Admitting: Neurology

## 2021-02-02 ENCOUNTER — Telehealth (INDEPENDENT_AMBULATORY_CARE_PROVIDER_SITE_OTHER): Payer: BC Managed Care – PPO | Admitting: Neurology

## 2021-02-02 ENCOUNTER — Other Ambulatory Visit: Payer: Self-pay

## 2021-02-02 ENCOUNTER — Encounter: Payer: Self-pay | Admitting: Neurology

## 2021-02-02 VITALS — Ht 72.0 in | Wt 183.0 lb

## 2021-02-02 DIAGNOSIS — R55 Syncope and collapse: Secondary | ICD-10-CM | POA: Diagnosis not present

## 2021-02-02 DIAGNOSIS — G43109 Migraine with aura, not intractable, without status migrainosus: Secondary | ICD-10-CM

## 2021-02-02 NOTE — Patient Instructions (Signed)
1. Samples for Ubrelvy and Nurtec will be at the front, let us know which is more helpful for your migraines.  2. Try these exercises when you start feeling like you will pass out, but also start doing it 2 times a day as to help prevent these symptoms.  [email or web address]  3. Follow-up in 6-8 months, call for any changes

## 2021-02-02 NOTE — Progress Notes (Signed)
Virtual Visit via Video Note The purpose of this virtual visit is to provide medical care while limiting exposure to the novel coronavirus.    Consent was obtained for video visit:  Yes.   Answered questions that patient had about telehealth interaction:  Yes.   I discussed the limitations, risks, security and privacy concerns of performing an evaluation and management service by telemedicine. I also discussed with the patient that there may be a patient responsible charge related to this service. The patient expressed understanding and agreed to proceed.  Pt location: Home Physician Location: office Name of referring provider:  Gordan Payment., MD I connected with Theodore Woods at patients initiation/request on 02/02/2021 at  3:30 PM EDT by video enabled telemedicine application and verified that I am speaking with the correct person using two identifiers. Pt MRN:  [id number] Pt DOB:  June 07, 2001 Video Participants:  Theodore Woods;  [name] (mother)   History of Present Illness:  The patient had a virtual video visit on 02/02/2021. He was last seen in the neurology clinic 9 months ago for recurrent episodes of loss of consciousness and migraines. MRI brain and EEG unremarkable. Echocardiogram in 03/2019 showed EF 55-60%, moderately dilated right ventricle, mildly dilated right atrium. He has seen Cardiology and had a heart monitor that was reportedly normal, results unavailable for review, however typical events were not captured. He has not had any syncopal episodes since December 2018, however continues to have near syncope where he would get a look in his eyes and tell his mother "I'm going to pass out," he would not talk for a few minutes. He would not fully lose consciousness. Last episode was last weekend while carrying the laundry basket. He has migraines around 1-2 times a month. He has been paying attention more and would lay down in the dark and take Ibuprofen, however due  to family history of kidney disease, he has tried Tylenol which does not help as much. He continues to see psychiatry, Wellbutrin was recently added. He sleeps well with melatonin, getting 6-7 hours of sleep. No falls.    History on Initial Assessment 01/17/2018: This is an 20 year old right-handed man with a history of anxiety, ADHD, presenting for evaluation of recurrent episodes of loss of consciousness. He initially started having them when he was at band camp in the summer and was attributed to dehydration. He would start feeling really hot and lightheaded, then pass out. His mother had instructions for co-parents on what to do for him, co-parents have described him as being pale when they come to him. After the syncopal events, he would have a headache with right-sided throbbing, no nausea/vomiting. In December, he had an episode that occurred outside of band. He was standing then started feeling weird like something was wrong, then suddenly had a horrible headache and woke up on the ground. He states this headache was different with a lot more pain, nausea, and headache lasted for several hours after, with sensitivity to light and sound. His vision felt blurred. Another time he was inside an [employer] for a concert. He got up and started having a bad headache and feeling lightheaded. He sat down and felt like he could not move well. He told his teacher and sat inside a room and called his mother. His mother was upset that he was left alone, she "had to keep him from passing out," screaming at him on the phone because he was not making sense  trying to communicate with her. She kept screaming on the phone until he finally said "yeah" and she found him half-slouched on the wall sitting on the floor, pale and clammy with zero energy. He had not been doing anything strenuous that time. The last episode occurred in January, he was driving when he felt lightheaded and his head really hurt, similar to  the headache in December. His father switched seats with him, and as he moved to the passenger seat he nearly passed out. They denied any true loss of consciousness this time. The headache again lasted for a few hours. He has not had any further syncopal episodes but has complained of feeling lightheaded. He usually has to lay down and go to sleep, Advil helps some with the headaches.   He and his mother report that he was having daily headaches in January and was "eating Advil." He started Claritin with a noticeable decrease in headaches last month. He has not needed Advil in a couple of weeks. He still has milder headaches around twice a week on the right side, but has talked to his mother about the headaches being all over as well. He has occasional high pitched tinnitus. He has occasional neck pain. He was previously on Lexapro then switched to Prozac last month for anxiety. He has not taken Xanax yet. He takes Concerta daily for ADHD. His mother denies any staring spells. He denies any olfactory/gustatory hallucinations, deja vu, rising epigastric sensation, focal numbness/tingling/weakness, myoclonic jerks. No diplopia, dysarthria/dysphagia, bowel/bladder dysfunction. Sleep is okay, he usually gest 8 hours of sleep. There is a family history of migraines in his mother and maternal grandmother.  He had a normal birth and early development.  There is no history of febrile convulsions, CNS infections such as meningitis/encephalitis, significant traumatic brain injury, neurosurgical procedures, or family history of seizures   Current Outpatient Medications on File Prior to Visit  Medication Sig Dispense Refill  . ALPRAZolam (XANAX) 0.5 MG tablet TAKE 1/2-1 TABLET AS NEEDED FOR ANXIETY/PANIC  2  . buPROPion (WELLBUTRIN XL) 300 MG 24 hr tablet Take 1 tablet by mouth every morning.    Marland Kitchen FLUoxetine (PROZAC) 10 MG tablet Take 10 mg by mouth daily.  5  . FLUoxetine (PROZAC) 20 MG capsule Take 20 mg by mouth  daily.  12  . ibuprofen (ADVIL,MOTRIN) 200 MG tablet Take 600 mg by mouth every 6 (six) hours as needed. For pain.    Marland Kitchen loratadine (CLARITIN) 10 MG tablet Take 10 mg by mouth daily as needed. For allergies.    . Methylphenidate HCl ER 54 MG TB24 Take 54 mg by mouth daily.    Marland Kitchen omeprazole (PRILOSEC) 20 MG capsule Take 20 mg by mouth every other day.   3  . methylphenidate (CONCERTA) 54 MG CR tablet Take 54 mg by mouth every morning. (Patient not taking: Reported on 02/02/2021)    . Probiotic Product (PROBIOTIC FORMULA PO) Take 1 tablet by mouth daily. (Patient not taking: Reported on 02/02/2021)     No current facility-administered medications on file prior to visit.     Observations/Objective:   Vitals:   02/02/21 1418  Weight: 183 lb (83 kg)  Height: 6' (1.829 m)   GEN:  The patient appears stated age and is in NAD.  Neurological examination: Patient is awake, alert. No aphasia or dysarthria. Intact fluency and comprehension. Cranial nerves: Extraocular movements intact with no nystagmus. No facial asymmetry. Motor: moves all extremities symmetrically, at least anti-gravity x 4.  No incoordination on finger to nose testing. Gait: narrow-based and steady, able to tandem walk adequately. Negative Romberg test.   Assessment and Plan:   This is a 20 yo RH man with a history of anxiety, ADHD, with recurrent episodes of near syncope/syncope with associated significant headaches. He has not had any syncopal episodes since December 2018 but continues to have near syncopal episodes and migraines. MRI brain and EEG unremarkable. Echocardiogram was abnormal, he had a cardiac monitor which was reportedly normal, however typical events were not captured. Continue follow-up with Cardiology. Try isometric exercises, increase water intake. He has 1-2 migraines a month, we discussed trying a different migraine rescue medication. Due to cardiac issues, would avoid triptans. Samples for Ubrelvy and Nurtec will be  provided, side effects discussed, they will let us know if helpful. We discussed that if he has another full syncopal episode, consider starting Topiramate for migraine and possible seizure prophylaxis. We discussed how Wellbutrin can potentially lower seizure threshold, they will discuss with Psychiatry. Follow-up in 6-8 months, call for any changes.    Follow Up Instructions:   -I discussed the assessment and treatment plan with the patient. The patient was provided an opportunity to ask questions and all were answered. The patient agreed with the plan and demonstrated an understanding of the instructions.   The patient was advised to call back or seek an in-person evaluation if the symptoms worsen or if the condition fails to improve as anticipated.    Van Clines, MD

## 2021-10-30 ENCOUNTER — Telehealth (INDEPENDENT_AMBULATORY_CARE_PROVIDER_SITE_OTHER): Payer: BC Managed Care – PPO | Admitting: Neurology

## 2021-10-30 ENCOUNTER — Encounter: Payer: Self-pay | Admitting: Neurology

## 2021-10-30 VITALS — Ht 72.0 in | Wt 200.0 lb

## 2021-10-30 DIAGNOSIS — G43109 Migraine with aura, not intractable, without status migrainosus: Secondary | ICD-10-CM | POA: Diagnosis not present

## 2021-10-30 DIAGNOSIS — R55 Syncope and collapse: Secondary | ICD-10-CM | POA: Diagnosis not present

## 2021-10-30 NOTE — Patient Instructions (Signed)
Good to see you! Continue with staying hydrated. Monitor migraines. If there is an increase in migraines or you would like to start Topamax, please call our office. Follow-up in 8 months, call for any changes.

## 2021-10-30 NOTE — Progress Notes (Signed)
Virtual Visit via Video Note The purpose of this virtual visit is to provide medical care while limiting exposure to the novel coronavirus.    Consent was obtained for video visit:  Yes.   Answered questions that patient had about telehealth interaction:  Yes.   I discussed the limitations, risks, security and privacy concerns of performing an evaluation and management service by telemedicine. I also discussed with the patient that there may be a patient responsible charge related to this service. The patient expressed understanding and agreed to proceed.  Pt location: Home Physician Location: office Name of referring provider:  Gordan Payment., MD I connected with Theodore Woods at patients initiation/request on 10/30/2021 at  3:30 PM EST by video enabled telemedicine application and verified that I am speaking with the correct person using two identifiers. Pt MRN:  [id number] Pt DOB:  2001/05/07 Video Participants:  Theodore Woods;  Theodore Woods (mother)   History of Present Illness:  The patient had a virtual video visit on 10/30/2021. He was last seen 8 months ago for recurrent episodes of loss of consciousness and migraines. MRI brain and EEG unremarkable. Echocardiogram in 03/2019 showed EF 55-60%, moderately dilated right ventricle, mildly dilated right atrium. He has seen Cardiology and had a heart monitor that was reportedly normal, results unavailable for review, however typical events were not captured. He was offered a stress test that was "completely optional," so they decided to hold off. He has not had any frank syncopal episodes since December 2018. He continues to report near syncope/dizziness lasting 5-10 minutes, he usually tries to sit or lie down but this dow not calm them down. He can talk but speech gets slurry. They occur every couple of weeks. He stays hydrated. He at least a couple of migraines a month, we had previously discussed starting Topiramate which he  declined since he is on a lot of medications. No change in sleep pattern. however continues to have near syncope where he would get a look in his eyes and tell his mother "I'm going to pass out," he would not talk for a few minutes. He would not fully lose consciousness. Last episode was last weekend while carrying the laundry basket. He has migraines around 1-2 times a month. He has been paying attention more and would lay down in the dark and take Ibuprofen, however due to family history of kidney disease, he has tried Tylenol which does not help as much. He continues to see psychiatry, Wellbutrin was recently added. He sleeps well with melatonin, getting 6-7 hours of sleep. No falls.    History on Initial Assessment 01/17/2018: This is an 20 year old right-handed man with a history of anxiety, ADHD, presenting for evaluation of recurrent episodes of loss of consciousness. He initially started having them when he was at band camp in the summer and was attributed to dehydration. He would start feeling really hot and lightheaded, then pass out. His mother had instructions for co-parents on what to do for him, co-parents have described him as being pale when they come to him. After the syncopal events, he would have a headache with right-sided throbbing, no nausea/vomiting. In December, he had an episode that occurred outside of band. He was standing then started feeling weird like something was wrong, then suddenly had a horrible headache and woke up on the ground. He states this headache was different with a lot more pain, nausea, and headache lasted for several hours after, with sensitivity  to light and sound. His vision felt blurred. Another time he was inside an [employer] for a concert. He got up and started having a bad headache and feeling lightheaded. He sat down and felt like he could not move well. He told his teacher and sat inside a room and called his mother. His mother was upset that he was  left alone, she "had to keep him from passing out," screaming at him on the phone because he was not making sense trying to communicate with her. She kept screaming on the phone until he finally said "yeah" and she found him half-slouched on the wall sitting on the floor, pale and clammy with zero energy. He had not been doing anything strenuous that time. The last episode occurred in January, he was driving when he felt lightheaded and his head really hurt, similar to the headache in December. His father switched seats with him, and as he moved to the passenger seat he nearly passed out. They denied any true loss of consciousness this time. The headache again lasted for a few hours. He has not had any further syncopal episodes but has complained of feeling lightheaded. He usually has to lay down and go to sleep, Advil helps some with the headaches.    He and his mother report that he was having daily headaches in January and was "eating Advil." He started Claritin with a noticeable decrease in headaches last month. He has not needed Advil in a couple of weeks. He still has milder headaches around twice a week on the right side, but has talked to his mother about the headaches being all over as well. He has occasional high pitched tinnitus. He has occasional neck pain. He was previously on Lexapro then switched to Prozac last month for anxiety. He has not taken Xanax yet. He takes Concerta daily for ADHD. His mother denies any staring spells. He denies any olfactory/gustatory hallucinations, deja vu, rising epigastric sensation, focal numbness/tingling/weakness, myoclonic jerks. No diplopia, dysarthria/dysphagia, bowel/bladder dysfunction. Sleep is okay, he usually gest 8 hours of sleep. There is a family history of migraines in his mother and maternal grandmother.  He had a normal birth and early development.  There is no history of febrile convulsions, CNS infections such as meningitis/encephalitis, significant  traumatic brain injury, neurosurgical procedures, or family history of seizures   Current Outpatient Medications on File Prior to Visit  Medication Sig Dispense Refill   ALPRAZolam (XANAX) 0.5 MG tablet TAKE 1/2-1 TABLET AS NEEDED FOR ANXIETY/PANIC  2   buPROPion (WELLBUTRIN XL) 300 MG 24 hr tablet Take 1 tablet by mouth every morning.     FLUoxetine (PROZAC) 10 MG tablet Take 10 mg by mouth daily.  5   FLUoxetine (PROZAC) 20 MG capsule Take 20 mg by mouth daily.  12   ibuprofen (ADVIL,MOTRIN) 200 MG tablet Take 600 mg by mouth every 6 (six) hours as needed. For pain.     loratadine (CLARITIN) 10 MG tablet Take 10 mg by mouth daily as needed. For allergies.     Methylphenidate HCl ER 54 MG TB24 Take 54 mg by mouth daily.     omeprazole (PRILOSEC) 20 MG capsule Take 20 mg by mouth every other day.   3   Probiotic Product (PROBIOTIC FORMULA PO) Take 1 tablet by mouth daily.     methylphenidate (CONCERTA) 54 MG CR tablet Take 54 mg by mouth every morning. (Patient not taking: Reported on 02/02/2021)     No current  facility-administered medications on file prior to visit.     Observations/Objective:   Vitals:   10/30/21 0847  Weight: 200 lb (90.7 kg)  Height: 6' (1.829 m)   GEN:  The patient appears stated age and is in NAD.  Neurological examination: Patient is awake, alert. No aphasia or dysarthria. Intact fluency and comprehension. Cranial nerves: Extraocular movements intact with no nystagmus. No facial asymmetry. Motor: moves all extremities symmetrically, at least anti-gravity x 4.    Assessment and Plan:   This is a 20 yo RH man with a history of anxiety, ADHD, with recurrent episodes of near syncope/syncope with associated significant headaches. He has not had any full syncopal episodes since December 2018 but continues to report near syncopal episodes and migraines. MRI brain and EEG unremarkable. We again discussed starting Topiramate for migraine prophylaxis, assessing if this  would also reduce frequency of dizzy spells, he feels that he is on a lot of medications and would like to continue holding off for now. He agrees to consider starting medication if he has another syncopal episode. Follow-up in 8 months, call for any changes.    Follow Up Instructions:   -I discussed the assessment and treatment plan with the patient. The patient was provided an opportunity to ask questions and all were answered. The patient agreed with the plan and demonstrated an understanding of the instructions.   The patient was advised to call back or seek an in-person evaluation if the symptoms worsen or if the condition fails to improve as anticipated.    Van Clines, MD  CC: Dr. Shary Decamp

## 2022-08-02 ENCOUNTER — Ambulatory Visit: Payer: BC Managed Care – PPO | Admitting: Neurology

## 2023-02-10 ENCOUNTER — Ambulatory Visit: Payer: BC Managed Care – PPO | Admitting: Neurology

## 2023-02-10 ENCOUNTER — Encounter: Payer: Self-pay | Admitting: Neurology

## 2023-02-10 VITALS — BP 136/78 | HR 92 | Ht 72.0 in | Wt 228.4 lb

## 2023-02-10 DIAGNOSIS — R931 Abnormal findings on diagnostic imaging of heart and coronary circulation: Secondary | ICD-10-CM

## 2023-02-10 DIAGNOSIS — G43109 Migraine with aura, not intractable, without status migrainosus: Secondary | ICD-10-CM | POA: Diagnosis not present

## 2023-02-10 DIAGNOSIS — R55 Syncope and collapse: Secondary | ICD-10-CM | POA: Diagnosis not present

## 2023-02-10 MED ORDER — TOPIRAMATE 25 MG PO TABS
ORAL_TABLET | ORAL | 6 refills | Status: DC
Start: 2023-02-10 — End: 2024-08-13

## 2023-02-10 NOTE — Patient Instructions (Signed)
Good to see you.  Schedule repeat echocardiogram  2. Start Topiramate (Topamax) 25mg : take 1 tablet every night  3. Keep a calendar of the migraines and episodes where dizziness/like you will pass out  4. Follow-up in 4 months, call for any changes

## 2023-02-10 NOTE — Progress Notes (Signed)
NEUROLOGY FOLLOW UP OFFICE NOTE  Theodore PuntCameron A Woods [id number] 11-Jan-2001  HISTORY OF PRESENT ILLNESS: I had the pleasure of seeing Theodore Woods in follow-up in the neurology clinic on 02/10/2023.  The patient was last seen over a year ago for recurrent episodes of loss of consciousness and migraines. MRI brain and EEG unremarkable. Echocardiogram in 03/2019 showed EF 55-60%, moderately dilated right ventricle, mildly dilated right atrium. He has seen Cardiology and had a heart monitor that was reportedly normal, results unavailable for review, however typical events were not captured. He was offered a stress test that was "completely optional," so they decided to hold off. They have not been able to follow-up, however he continues to report episodes where he will pass out. He has not had any full frank syncope since December 2018. No staring/unresponsive episodes. The near syncopal episodes occur every 1-2 months, last episode was last month. No clear triggers. The migraines have been occurring pretty regular, 2-3 times a week. He would take 4 Advil and lay down. There is occasional nausea, he is sensitive to lights and sounds. No vision changes, focal numbness/tingling/weakness. No falls. He gets an average of 7-8 hours of sleep. He is starting to snore. He has occasional daytime drowsiness. 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 He denies any chest pains or shortness of breath.    History on Initial Assessment 01/17/2018: This is an 22 year old right-handed man with a history of anxiety, ADHD, presenting for evaluation of recurrent episodes of loss of consciousness. He initially started having them when he was at band camp in the summer and was attributed to dehydration. He would start feeling really hot and lightheaded, then pass out. His mother had instructions for co-parents on what to do for him, co-parents have described him as being pale when they come to him. After the  syncopal events, he would have a headache with right-sided throbbing, no nausea/vomiting. In December, he had an episode that occurred outside of band. He was standing then started feeling weird like something was wrong, then suddenly had a horrible headache and woke up on the ground. He states this headache was different with a lot more pain, nausea, and headache lasted for several hours after, with sensitivity to light and sound. His vision felt blurred. Another time he was inside an [employer] preparing for a concert. He got up and started having a bad headache and feeling lightheaded. He sat down and felt like he could not move well. He told his teacher and sat inside a room and called his mother. His mother was upset that he was left alone, she "had to keep him from passing out," screaming at him on the phone because he was not making sense trying to communicate with her. She kept screaming on the phone until he finally said "yeah" and she found him half-slouched on the wall sitting on the floor, pale and clammy with zero energy. He had not been doing anything strenuous that time. The last episode occurred in January, he was driving when he felt lightheaded and his head really hurt, similar to the headache in December. His father switched seats with him, and as he moved to the passenger seat he nearly passed out. They denied any true loss of consciousness this time. The headache again lasted for a few hours. He has not had any further syncopal episodes but has complained of feeling lightheaded. He usually has to lay down and go to sleep,  Advil helps some with the headaches.    He and his mother report that he was having daily headaches in January and was "eating Advil." He started Claritin with a noticeable decrease in headaches last month. He has not needed Advil in a couple of weeks. He still has milder headaches around twice a week on the right side, but has talked to his mother about the headaches being  all over as well. He has occasional high pitched tinnitus. He has occasional neck pain. He was previously on Lexapro then switched to Prozac last month for anxiety. He has not taken Xanax yet. He takes Concerta daily for ADHD. His mother denies any staring spells. He denies any olfactory/gustatory hallucinations, deja vu, rising epigastric sensation, focal numbness/tingling/weakness, myoclonic jerks. No diplopia, dysarthria/dysphagia, bowel/bladder dysfunction. Sleep is okay, he usually gest 8 hours of sleep. There is a family history of migraines in his mother and maternal grandmother.  He had a normal birth and early development.  There is no history of febrile convulsions, CNS infections such as meningitis/encephalitis, significant traumatic brain injury, neurosurgical procedures, or family history of seizures  PAST MEDICAL HISTORY: Past Medical History:  Diagnosis Date   ADHD (attention deficit hyperactivity disorder)    Allergic rhinitis    Anxiety    Followed by Dr. Denman George and Dr. Toni Arthurs   Autism spectrum disorder    GERD without esophagitis 03/08/2018   Syncope 07/2018   Weight loss     MEDICATIONS: Current Outpatient Medications on File Prior to Visit  Medication Sig Dispense Refill   ALPRAZolam (XANAX) 0.5 MG tablet TAKE 1/2-1 TABLET AS NEEDED FOR ANXIETY/PANIC  2   buPROPion (WELLBUTRIN XL) 300 MG 24 hr tablet Take 1 tablet by mouth every morning.     FLUoxetine (PROZAC) 10 MG tablet Take 10 mg by mouth daily.  5   FLUoxetine (PROZAC) 20 MG capsule Take 20 mg by mouth daily.  12   ibuprofen (ADVIL,MOTRIN) 200 MG tablet Take 600 mg by mouth every 6 (six) hours as needed. For pain.     loratadine (CLARITIN) 10 MG tablet Take 10 mg by mouth daily as needed. For allergies.     Methylphenidate HCl ER 54 MG TB24 Take 54 mg by mouth daily.     Multiple Vitamin (MULTIVITAMIN) tablet Take 1 tablet by mouth daily.     omeprazole (PRILOSEC) 20 MG capsule Take 20 mg by mouth every other day.   3    No current facility-administered medications on file prior to visit.    ALLERGIES: No Known Allergies  FAMILY HISTORY: Family History  Problem Relation Age of Onset   Migraines Mother    Anxiety disorder Mother    Asthma Sister    Heart disease Maternal Grandmother    Hyperlipidemia Maternal Grandmother    Hypertension Maternal Grandmother    Diabetes Father    Hypertension Father    Bladder Cancer Maternal Grandfather    Cancer Paternal Grandfather     SOCIAL HISTORY: Social History   Socioeconomic History   Marital status: Single    Spouse name: Not on file   Number of children: Not on file   Years of education: Not on file   Highest education level: Not on file  Occupational History   Not on file  Tobacco Use   Smoking status: Never   Smokeless tobacco: Never  Vaping Use   Vaping Use: Never used  Substance and Sexual Activity   Alcohol use: No   Drug use:  No   Sexual activity: Never  Other Topics Concern   Not on file  Social History Narrative   Pt lives in split level home with his parents, and his sister who is currently in [place] (home during [place])   Currently a senior in high school   Right handed    Social Determinants of Health   Financial Resource Strain: Not on file  Food Insecurity: Not on file  Transportation Needs: Not on file  Physical Activity: Not on file  Stress: Not on file  Social Connections: Not on file  Intimate Partner Violence: Not on file     PHYSICAL EXAM: Vitals:   02/10/23 0822  BP: 136/78  Pulse: 92  SpO2: 97%   General: No acute distress Head:  Normocephalic/atraumatic Skin/Extremities: No rash, no edema Neurological Exam: alert and awake. No aphasia or dysarthria. Fund of knowledge is appropriate.  Attention and concentration are normal.   Cranial nerves: Pupils equal, round. Extraocular movements intact with no nystagmus. Visual fields full.  No facial asymmetry.  Motor: Bulk and tone normal, muscle  strength 5/5 throughout with no pronator drift.   Finger to nose testing intact.  Gait narrow-based and steady, able to tandem walk adequately.  Romberg negative.   IMPRESSION: This is a 23 yo RH man with a history of anxiety, ADHD, with recurrent episodes of near syncope/syncope with associated significant headaches. He has not had any full syncopal episodes since December 2018 but continues to have near syncopal episodes and frequent migraines. MRI brain and EEG unremarkable. He is hesitant but agreeable to starting low dose Topiramate 25mg  qhs for migraine prophylaxis. Side effects discussed, may uptitrate as tolerated. He was advised to keep a calendar of his symptoms. They have not seen Cardiology recently, we discussed repeating echocardiogram, echo in 2020 showed EF 55-60%, moderately dilated right ventricle, mildly dilated right atrium. Follow-up in 4 months, call for any changes.    Thank you for allowing me to participate in his care.  Please do not hesitate to call for any questions or concerns.    Patrcia Dolly, M.D.   CC: Dr. Shary Decamp

## 2023-04-13 ENCOUNTER — Ambulatory Visit (HOSPITAL_COMMUNITY)
Admission: RE | Admit: 2023-04-13 | Discharge: 2023-04-13 | Disposition: A | Discharge status: Home / Self Care | Payer: BC Managed Care – PPO | Source: Ambulatory Visit | Attending: Neurology | Admitting: Neurology

## 2023-04-13 DIAGNOSIS — K219 Gastro-esophageal reflux disease without esophagitis: Secondary | ICD-10-CM | POA: Insufficient documentation

## 2023-04-13 DIAGNOSIS — R931 Abnormal findings on diagnostic imaging of heart and coronary circulation: Secondary | ICD-10-CM | POA: Diagnosis not present

## 2023-04-13 DIAGNOSIS — R55 Syncope and collapse: Secondary | ICD-10-CM | POA: Diagnosis present

## 2023-04-13 NOTE — Progress Notes (Signed)
Echocardiogram 2D Echocardiogram has been performed.  Augustine Radar 04/13/2023, 4:09 PM

## 2023-04-14 LAB — ECHOCARDIOGRAM COMPLETE
Area-P 1/2: 4.49 cm2
Calc EF: 55.1 %
S' Lateral: 3.1 cm
Single Plane A2C EF: 53.5 %
Single Plane A4C EF: 54.8 %

## 2023-04-29 ENCOUNTER — Telehealth: Payer: Self-pay

## 2023-04-29 NOTE — Telephone Encounter (Signed)
-----   Message from Van Clines, MD sent at 04/29/2023  9:02 AM EDT ----- Pls let mom know the echocardiogram report sounds better than his prior one, there are still some mild changes on the right ventricle. If they are agreeable, I would like to refer him to a different cardiologist Dr. Rosemary Holms and see if there is anything else we need to do with this. Thanks

## 2023-04-29 NOTE — Telephone Encounter (Signed)
echocardiogram report sounds better than his prior one, there are still some mild changes on the right ventricle. If they are agreeable, I would like to refer him to a different cardiologist Dr. Rosemary Holms and see if there is anything else we need to do with this. Pt mom is asking what group he is with? 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

## 2023-05-02 NOTE — Telephone Encounter (Signed)
He is with [facility]

## 2023-05-02 NOTE — Telephone Encounter (Signed)
Called pt mother no answer left a voice mail to call the office back

## 2023-05-03 NOTE — Telephone Encounter (Signed)
Pt mother called informed cardiologist with [facility]

## 2023-06-13 ENCOUNTER — Ambulatory Visit: Payer: BC Managed Care – PPO | Admitting: Neurology

## 2023-06-24 ENCOUNTER — Ambulatory Visit: Payer: BC Managed Care – PPO | Admitting: Psychiatry

## 2023-07-08 ENCOUNTER — Ambulatory Visit: Payer: BC Managed Care – PPO | Admitting: Psychiatry

## 2023-07-08 VITALS — BP 134/87 | HR 79 | Ht 72.0 in | Wt 224.2 lb

## 2023-07-08 DIAGNOSIS — F902 Attention-deficit hyperactivity disorder, combined type: Secondary | ICD-10-CM | POA: Diagnosis not present

## 2023-07-08 DIAGNOSIS — F429 Obsessive-compulsive disorder, unspecified: Secondary | ICD-10-CM | POA: Diagnosis not present

## 2023-07-08 DIAGNOSIS — F840 Autistic disorder: Secondary | ICD-10-CM

## 2023-07-08 DIAGNOSIS — F411 Generalized anxiety disorder: Secondary | ICD-10-CM | POA: Diagnosis not present

## 2023-07-08 MED ORDER — FLUOXETINE HCL 40 MG PO CAPS: 40.0000 mg | ORAL_CAPSULE | Freq: Every day | ORAL | 1 refills | Status: DC

## 2023-07-11 ENCOUNTER — Encounter: Payer: Self-pay | Admitting: Psychiatry

## 2023-07-11 NOTE — Progress Notes (Signed)
[facility] [address], [place]   New patient visit Date of Service: 07/08/2023  Referral Source: self History From: patient, chart review, parent    New Patient Appointment     Theodore Woods is a 22 y.o. male with a history significant for Woods, ADHD, anxiety, OCD. Patient is currently taking the following medications:  - Prozac 30mg  daily - Concerta 54mg  daily - Wellbutrin XL 300mg  daily _______________________________________________________________  Theodore Woods presents to clinic with his mother. They were interviewed together per his request.  They report that Theodore Woods. These were diagnosed when he was younger via testing. His Woods includes difficulty with social interactions, nonverbal communication, relationships. He has a restricted range of interests, is inflexible and rigid, and has hypersensitivity to things. This leads to him often staying home, doing few new things, eating the same food consistently. His ADHD includes issues with focus. He has been on Concerta for a while which seems to do well and provide benefit. They both currently feel his ADHD symptoms are well managed on his current medicine regimen.  They report that Theodore Woods has a history of depression dating back to 2021. This was when some friends online were mean and rude and stopped playing with him. He felt abandoned and went into a depressive episode. Since then he has had frequent periods of low moods and depression. Currently he feels his mood is up and down. He feels stressed by things often, feels low energy, less interest in things at times. Denies any SI.  He reports some anxiety as well. He feels stressed and anxious about work, new things, online relationships. When appointments are coming up or new things are happening this really bothers him. He feels unable to control his anxiety at times, feels on edge and irritable. He also reports that he  has some OCD type behaviors around his controllers and grease. He has an obsession with not getting grease on things or on his hands. He will wash his hands constantly - about 7 times per hour. Typically he does this whenever he touches anything. This does bother him and he wishes this were different. Discussed some potential medicine changes we can make.      Current suicidal/homicidal ideations: denied Current auditory/visual hallucinations: denied Sleep: stable Appetite: Stable Depression: see HPI Bipolar symptoms: denies Woods: see HPI Encopresis/Enuresis: denies Tic: denies Generalized Anxiety Disorder: see HPI Other anxiety: denies Obsessions and Compulsions: see HPI Trauma/Abuse: denies ADHD: see HPI  ROS     Current Outpatient Medications:    ALPRAZolam (XANAX) 0.5 MG tablet, TAKE 1/2-1 TABLET AS NEEDED FOR ANXIETY/PANIC, Disp: , Rfl: 2   buPROPion (WELLBUTRIN XL) 300 MG 24 hr tablet, Take 1 tablet by mouth every morning., Disp: , Rfl:    FLUoxetine (PROZAC) 40 MG capsule, Take 1 capsule (40 mg total) by mouth daily., Disp: 60 capsule, Rfl: 1   ibuprofen (ADVIL,MOTRIN) 200 MG tablet, Take 600 mg by mouth every 6 (six) hours as needed. For pain., Disp: , Rfl:    loratadine (CLARITIN) 10 MG tablet, Take 10 mg by mouth daily as needed. For allergies., Disp: , Rfl:    Methylphenidate HCl ER 54 MG TB24, Take 54 mg by mouth daily., Disp: , Rfl:    Multiple Vitamin (MULTIVITAMIN) tablet, Take 1 tablet by mouth daily., Disp: , Rfl:    omeprazole (PRILOSEC) 20 MG capsule, Take 20 mg by mouth every other day. , Disp: , Rfl: 3  topiramate (TOPAMAX) 25 MG tablet, Take 1 tablet every morning, Disp: 30 tablet, Rfl: 6   No Known Allergies    Psychiatric History: Previous diagnoses/symptoms: ADHD, Woods, anxiety, OCD Non-Suicidal Self-Injury: denies Suicide Attempt History: denies Violence History: denies  Current psychiatric provider: Dr Toni Arthurs Psychotherapy: Dr Denman George Previous  psychiatric medication trials:  Lexapro Psychiatric hospitalizations: denies History of trauma/abuse: some bullying from online peers    Past Medical History:  Diagnosis Date   ADHD (attention deficit hyperactivity disorder)    Allergic rhinitis    Anxiety    Followed by Dr. Denman George and Dr. Toni Arthurs   Autism spectrum disorder    GERD without esophagitis 03/08/2018   Syncope 07/2018   Weight loss     History of head trauma? No History of seizures?  No     Substance use reviewed with pt, with pertinent items below: denies  History of substance/alcohol abuse treatment: n/a     Family psychiatric history: denies   Family history of suicide? denies    Current Living Situation (including members of house hold): lives with mom, dad. 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  General Appearance: Fairly Groomed and Guarded  Eye Contact:  Fair  Speech:  Clear and Coherent and Normal Rate  Mood:  Anxious  Affect:  Constricted  Thought Process:  Goal Directed  Orientation:  Full (Time, Place, and Person)  Thought Content:  Logical  Suicidal Thoughts:  No  Homicidal Thoughts:  No  Memory:  Immediate;   Fair  Judgement:  Fair  Insight:  Fair  Psychomotor Activity:  Normal  Concentration:  Concentration: Good  Recall:  Good  Fund of Knowledge:  Good  Language:  Good  Cognition:  WNL     Assessment   Psychiatric Diagnoses:   ICD-10-CM   1. Autism spectrum disorder  F84.0     2. Attention deficit hyperactivity disorder (ADHD), combined type  F90.2     3. Obsessive-compulsive disorder, unspecified type  F42.9     4.  Generalized anxiety disorder  F41.1        Medical Diagnoses: Patient Active Problem List   Diagnosis Date Noted   Right ventricular enlargement 04/20/2019   Atypical chest pain 03/22/2019   Dyspnea on exertion 03/22/2019   Autism spectrum disorder    Anxiety    Allergic rhinitis    ADHD (attention deficit hyperactivity disorder)    Syncope 07/02/2018   Attention deficit hyperactivity disorder (ADHD), combined type 03/09/2018   Acne 03/08/2018   Auditory processing disorder 03/08/2018   GERD without esophagitis 03/08/2018   High risk medication use 03/08/2018   Malaise and fatigue 03/08/2018   Migraine without status migrainosus, not intractable 03/08/2018   Moderate episode of recurrent major depressive disorder (HCC) 03/08/2018   Panic disorder with agoraphobia 03/08/2018   Weight loss 03/08/2018   Sprain of anterior talofibular ligament of left ankle 08/03/2016     Medical Decision Making: moderate  ACEION VAAL is a 22 y.o. male with a history detailed above.   On evaluation [place] has symptoms consistent with his previous diagnoses of ADHD, Woods, anxiety, OCD, and depression.His Woods and ADHD  were diagnosed years ago and he reports symptoms consistent with both disorders. The primary concerns today are his anxiety, depression, and OCD. His mood has been depressed on and off since about 2021 when he had a falling out with peers. His reports some continued symptoms of depression at this time. He also reports frequent anxiety about many things, including new places, new events, or changes in his routine. This is difficult for him to manage at this time.  He reports symptoms consistent with OCD. He washes his hands about 6-7 times per hour, typically whenever he touches anything. He worries about transferring grease from anything to his controlled or other surfaces. He feels that this does impact his life a fair amount.  Discussed adjusting his medicines as below and he is  agreeable to this. No SI/HI/AVH.  There are no identified acute safety concerns. Continue outpatient level of care.     Plan  Medication management:  - Increase Prozac to 40mg  daily for mood, anxiety, OCD  - Continue Concerta 54mg  daily  - Continue Wellbutrin XL 300mg  daily  Labs/Studies:  - reviewed  Additional recommendations:  - Continue with current therapist, Crisis plan reviewed and patient verbally contracts for safety. Go to ED with emergent symptoms or safety concerns, and Risks, benefits, side effects of medications, including any / all black box warnings, discussed with patient, who verbalizes their understanding   Follow Up: Return in 1 month - Call in the interim for any side-effects, decompensation, questions, or problems between now and the next visit.   I have spend 60 minutes reviewing the patients chart, meeting with the patient and family, and reviewing medications and potential side effects for their condition of Woods, ADHD, depression.  Kendal Hymen, MD [facility]

## 2023-08-15 ENCOUNTER — Telehealth: Payer: Self-pay | Admitting: Neurology

## 2023-08-15 DIAGNOSIS — R55 Syncope and collapse: Secondary | ICD-10-CM

## 2023-08-15 DIAGNOSIS — R931 Abnormal findings on diagnostic imaging of heart and coronary circulation: Secondary | ICD-10-CM

## 2023-08-15 NOTE — Telephone Encounter (Signed)
My understanding from last call was mom was going to check if Dr. Rosemary Holms with [facility] is in his network. I think [facility] is now with [facility].

## 2023-08-15 NOTE — Telephone Encounter (Signed)
Pt's mother stated the cardiologist never called them to make an appointment.

## 2023-08-16 NOTE — Telephone Encounter (Signed)
Pt mother called informed that referral was sent and that [facility] is now part [facility] pt mother stated that she will let us know if hey are not in her network

## 2023-08-19 ENCOUNTER — Ambulatory Visit (INDEPENDENT_AMBULATORY_CARE_PROVIDER_SITE_OTHER): Payer: BC Managed Care – PPO | Admitting: Psychiatry

## 2023-08-19 DIAGNOSIS — F902 Attention-deficit hyperactivity disorder, combined type: Secondary | ICD-10-CM | POA: Diagnosis not present

## 2023-08-19 DIAGNOSIS — F411 Generalized anxiety disorder: Secondary | ICD-10-CM

## 2023-08-19 DIAGNOSIS — F429 Obsessive-compulsive disorder, unspecified: Secondary | ICD-10-CM | POA: Diagnosis not present

## 2023-08-19 DIAGNOSIS — F840 Autistic disorder: Secondary | ICD-10-CM

## 2023-08-19 MED ORDER — CLONAZEPAM 0.5 MG PO TBDP: 0.5000 mg | ORAL_TABLET | Freq: Two times a day (BID) | ORAL | 0 refills | Status: AC | PRN

## 2023-08-19 MED ORDER — BUPROPION HCL ER (XL) 300 MG PO TB24: 300.0000 mg | ORAL_TABLET | Freq: Every morning | ORAL | 1 refills | Status: DC

## 2023-08-22 ENCOUNTER — Encounter: Payer: Self-pay | Admitting: Psychiatry

## 2023-08-22 NOTE — Progress Notes (Addendum)
[facility] [address], [place]   Follow-up visit  Date of Service: 08/19/2023  CC/Purpose: Routine medication management follow up.    Theodore Woods is a 22 y.o. male with a past psychiatric history of ASD, ADHD, anxiety, depression who presents today for a psychiatric follow up appointment.    The patient was last seen on 07/08/23, at which time the following plan was established:  Medication management:             - Increase Prozac to 40mg  daily for mood, anxiety, OCD             - Continue Concerta 54mg  daily             - Continue Wellbutrin XL 300mg  daily _______________________________________________________________________________________ Acute events/encounters since last visit: none    Theodore Woods was interviewed alone and with his mother. They report that right after his last visit his cat of 16 years died. This was extremely hard on Theodore Woods. He picked this cat out when he was about 74 years old and she had been with him ever since. He viewed her as his companion and was very close to her. He has been tearful and sad often since this happened. He reports that otherwise its hard to say how things have been. He denies any thoughts of self harm or desire to self harm. Provided supportive therapy. He denies any HI/AVH.    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 Sister married and out of the house     No Known Allergies    Labs:  reviewed  Medical diagnoses: Patient Active Problem List   Diagnosis Date Noted   Right  ventricular enlargement 04/20/2019   Atypical chest pain 03/22/2019   Dyspnea on exertion 03/22/2019   Autism spectrum disorder    Anxiety    Allergic rhinitis    ADHD (attention deficit hyperactivity disorder)    Syncope 07/02/2018   Attention deficit hyperactivity disorder (ADHD), combined type 03/09/2018   Acne 03/08/2018   Auditory processing disorder 03/08/2018   GERD without esophagitis 03/08/2018   High risk medication use 03/08/2018   Malaise and fatigue 03/08/2018   Migraine without status migrainosus, not intractable 03/08/2018   Moderate episode of recurrent major depressive disorder (HCC) 03/08/2018   Panic disorder with agoraphobia 03/08/2018   Weight loss 03/08/2018   Sprain of anterior talofibular ligament of left ankle 08/03/2016    Psychiatric Specialty Exam: There were no vitals taken for this visit.There is no height or weight on file to calculate BMI.  General Appearance: Neat and Well Groomed  Eye Contact:  Fair  Speech:  Clear and Coherent and Normal Rate  Mood:  Dysphoric  Affect:  Tearful  Thought Process:  Goal Directed  Orientation:  Full (Time, Place, and Person)  Thought Content:  Logical  Suicidal Thoughts:  No  Homicidal Thoughts:  No  Memory:  Immediate;   Good  Judgement:  Good  Insight:  Good  Psychomotor Activity:  Normal  Concentration:  Concentration: Good  Recall:  Good  Fund of Knowledge:  Good  Language:  Good  Assets:  Communication Skills Desire for  Improvement Financial Resources/Insurance Housing Leisure Time Physical Health Resilience Social Support Talents/Skills Transportation Vocational/Educational  Cognition:  WNL      Assessment   Psychiatric Diagnoses:   ICD-10-CM   1. Autism spectrum disorder  F84.0     2. Attention deficit hyperactivity disorder (ADHD), combined type  F90.2     3. Obsessive-compulsive disorder, unspecified type  F42.9     4. Generalized anxiety disorder  F41.1       Patient  complexity: Moderate   Patient Education and Counseling:  Supportive therapy provided for identified psychosocial stressors.  Medication education provided and decisions regarding medication regimen discussed with patient/guardian.   On assessment today, Theodore Woods has struggled some since his last visit due to his cat of 16 years passing away. He has been grieving this death and has taken this quite hard. He does have some low feelings and some depression. He would like to stay on this dose of medicine for now. Provided supportive therapy. He denies any SI/HI/AVH.    Plan  Medication management:             - Continue Concerta 54mg  daily             - Continue Wellbutrin XL 300mg  daily  - Continue Prozac 40mg  daily for anxiety and mood  Labs/Studies:  - none today  Additional recommendations:  - Continue with current therapist, Crisis plan reviewed and patient verbally contracts for safety. Go to ED with emergent symptoms or safety concerns, and Risks, benefits, side effects of medications, including any / all black box warnings, discussed with patient, who verbalizes their understanding   Follow Up: Return in 1 month - Call in the interim for any side-effects, decompensation, questions, or problems between now and the next visit.   I have spent 30 minutes reviewing the patients chart, meeting with the patient and family, and reviewing medicines and side effects.  I spent 16 minutes providing supportive therapy, including empathic validation, praise, normalizing, grief, psychoeducation to both the child and their guardian for their current social and familial stressors.   Kendal Hymen, MD [facility]

## 2023-09-16 ENCOUNTER — Encounter: Payer: Self-pay | Admitting: Psychiatry

## 2023-09-16 ENCOUNTER — Ambulatory Visit: Payer: BC Managed Care – PPO | Admitting: Psychiatry

## 2023-09-16 DIAGNOSIS — F411 Generalized anxiety disorder: Secondary | ICD-10-CM

## 2023-09-16 DIAGNOSIS — F429 Obsessive-compulsive disorder, unspecified: Secondary | ICD-10-CM

## 2023-09-16 DIAGNOSIS — F902 Attention-deficit hyperactivity disorder, combined type: Secondary | ICD-10-CM | POA: Diagnosis not present

## 2023-09-16 DIAGNOSIS — F840 Autistic disorder: Secondary | ICD-10-CM

## 2023-09-16 MED ORDER — FLUOXETINE HCL 40 MG PO CAPS: 40.0000 mg | ORAL_CAPSULE | Freq: Every day | ORAL | 1 refills | Status: DC

## 2023-09-16 NOTE — Progress Notes (Signed)
[facility] [address], [place]   Follow-up visit  Date of Service: 09/16/2023  CC/Purpose: Routine medication management follow up.    Theodore Woods is a 22 y.o. male with a past psychiatric history of ASD, ADHD, anxiety, depression who presents today for a psychiatric follow up appointment.    The patient was last seen on 08/19/23, at which time the following plan was established: Medication management:             - Continue Concerta 54mg  daily             - Continue Wellbutrin XL 300mg  daily             - Continue Prozac 40mg  daily for anxiety and mood _______________________________________________________________________________________ Acute events/encounters since last visit: none    Theodore Woods was interviewed alone. He states that things have been going okay. He still feels sad often due to missing his cat. The holidays remind him of past memories. He is trying to think of the positive memories and feels this is helpful. He feels his anxiety is pretty controlled despite this. He feels the medicines are okay and doesn't feel that we should change them at this time. No other major concerns today. Some trouble with work with dad but they are working on this. He denies any HI/AVH.    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 Sister married and out of the house     No Known Allergies    Labs:  reviewed  Medical diagnoses: Patient Active Problem List   Diagnosis Date Noted   Right ventricular enlargement 04/20/2019   Atypical  chest pain 03/22/2019   Dyspnea on exertion 03/22/2019   Autism spectrum disorder    Anxiety    Allergic rhinitis    ADHD (attention deficit hyperactivity disorder)    Syncope 07/02/2018   Attention deficit hyperactivity disorder (ADHD), combined type 03/09/2018   Acne 03/08/2018   Auditory processing disorder 03/08/2018   GERD without esophagitis 03/08/2018   High risk medication use 03/08/2018   Malaise and fatigue 03/08/2018   Migraine without status migrainosus, not intractable 03/08/2018   Moderate episode of recurrent major depressive disorder (HCC) 03/08/2018   Panic disorder with agoraphobia 03/08/2018   Weight loss 03/08/2018   Sprain of anterior talofibular ligament of left ankle 08/03/2016    Psychiatric Specialty Exam: There were no vitals taken for this visit.There is no height or weight on file to calculate BMI.  General Appearance: Neat and Well Groomed  Eye Contact:  Fair  Speech:  Clear and Coherent and Normal Rate  Mood:  Dysphoric  Affect:  Tearful  Thought Process:  Goal Directed  Orientation:  Full (Time, Place, and Person)  Thought Content:  Logical  Suicidal Thoughts:  No  Homicidal Thoughts:  No  Memory:  Immediate;   Good  Judgement:  Good  Insight:  Good  Psychomotor Activity:  Normal  Concentration:  Concentration: Good  Recall:  Good  Fund of Knowledge:  Good  Language:  Good  Assets:  Communication Skills Desire for Improvement Financial Resources/Insurance Housing Leisure Time Physical Health Resilience Social Support Curator  Vocational/Educational  Cognition:  WNL      Assessment   Psychiatric Diagnoses:   ICD-10-CM   1. Autism spectrum disorder  F84.0     2. Attention deficit hyperactivity disorder (ADHD), combined type  F90.2     3. Obsessive-compulsive disorder, unspecified type  F42.9     4. Generalized anxiety disorder  F41.1        Patient complexity: Moderate   Patient Education and  Counseling:  Supportive therapy provided for identified psychosocial stressors.  Medication education provided and decisions regarding medication regimen discussed with patient/guardian.   On assessment today, Theodore Woods has been doing a bit better since his last visit. He has been taking his medicines regularly and doing therapy. His mood and grief are improving but he does continue to grieve his cat. We will not adjust his medicine as they appear helpful. He denies any SI/HI/AVH.    Plan  Medication management:             - Continue Concerta 54mg  daily             - Continue Wellbutrin XL 300mg  daily  - Continue Prozac 40mg  daily for anxiety and mood  - Klonopin 0.5mg  BID PRN  Labs/Studies:  - none today  Additional recommendations:  - Continue with current therapist, Crisis plan reviewed and patient verbally contracts for safety. Go to ED with emergent symptoms or safety concerns, and Risks, benefits, side effects of medications, including any / all black box warnings, discussed with patient, who verbalizes their understanding   Follow Up: Return in 2 month - Call in the interim for any side-effects, decompensation, questions, or problems between now and the next visit.   I have spent 30 minutes reviewing the patients chart, meeting with the patient and family, and reviewing medicines and side effects.  Kendal Hymen, MD [facility]

## 2023-11-11 ENCOUNTER — Encounter: Payer: Self-pay | Admitting: Psychiatry

## 2023-11-11 ENCOUNTER — Ambulatory Visit: Payer: BC Managed Care – PPO | Admitting: Psychiatry

## 2023-11-11 DIAGNOSIS — F411 Generalized anxiety disorder: Secondary | ICD-10-CM

## 2023-11-11 DIAGNOSIS — F902 Attention-deficit hyperactivity disorder, combined type: Secondary | ICD-10-CM | POA: Diagnosis not present

## 2023-11-11 DIAGNOSIS — F429 Obsessive-compulsive disorder, unspecified: Secondary | ICD-10-CM | POA: Diagnosis not present

## 2023-11-11 DIAGNOSIS — F840 Autistic disorder: Secondary | ICD-10-CM

## 2023-11-11 NOTE — Progress Notes (Addendum)
 [facility] [address], [place]    Follow-up visit  Date of Service: 11/11/2023  CC/Purpose: Routine medication management follow up.    Theodore Woods is a 23 y.o. male with a past psychiatric history of ASD, ADHD, anxiety, depression who presents today for a psychiatric follow up appointment.    The patient was last seen on 09/16/23, at which time the following plan was established: Medication management:             - Continue Concerta  54mg  daily             - Continue Wellbutrin  XL 300mg  daily             - Continue Prozac  40mg  daily for anxiety and mood             - Klonopin  0.5mg  BID PRN _______________________________________________________________________________________ Acute events/encounters since last visit: none    Nolan was interviewed alone, then I spoke with his mom alone. Cace report that things have been going okay. He has been taking his medicines as prescribed. He feels that things overall are going okay. He had a good holiday break and feels that he is able to enjoy things. He still notices that he will get low and grieve his cat. This happens most when he is alone at home or he sees her pictures in his room. His low moods are only when this is happening and all surround his cat. Discussed his medicine and how grief often doesn't respond to medicine. Encouraged him to talk with his therapist about his grief given ti's impact on him. Mom agrees that therapy would be helpful for this. Provided supportive therapy. He denies any HI/AVH.    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 Sister married and out of the house     No Known Allergies    Labs:  reviewed  Medical diagnoses: Patient Active Problem List   Diagnosis Date Noted   Right ventricular enlargement 04/20/2019   Atypical chest pain 03/22/2019   Dyspnea on exertion 03/22/2019   Autism spectrum disorder    Anxiety    Allergic rhinitis    ADHD (attention deficit hyperactivity disorder)    Syncope 07/02/2018   Attention deficit hyperactivity disorder (ADHD), combined type 03/09/2018   Acne 03/08/2018   Auditory processing disorder 03/08/2018   GERD without esophagitis 03/08/2018   High risk medication use 03/08/2018   Malaise and fatigue 03/08/2018   Migraine without status migrainosus, not intractable 03/08/2018   Moderate episode of recurrent major depressive disorder (HCC) 03/08/2018   Panic disorder with agoraphobia 03/08/2018   Weight loss 03/08/2018   Sprain of anterior talofibular ligament of left ankle 08/03/2016    Psychiatric Specialty Exam: There were no vitals taken for this visit.There is no height or weight on file to calculate BMI.  General Appearance: Neat and Well Groomed  Eye Contact:  Fair  Speech:  Clear and Coherent and Normal Rate  Mood:  Dysphoric  Affect:  Congruent  Thought Process:  Goal Directed  Orientation:  Full (Time, Place, and Person)  Thought Content:  Logical  Suicidal Thoughts:  No  Homicidal  Thoughts:  No  Memory:  Immediate;   Good  Judgement:  Good  Insight:  Good  Psychomotor Activity:  Normal  Concentration:  Concentration: Good  Recall:  Good  Fund of Knowledge:  Good  Language:  Good  Assets:  Communication Skills Desire for Improvement Financial Resources/Insurance Housing Leisure Time Physical Health Resilience Social Support Talents/Skills Transportation Vocational/Educational  Cognition:  WNL      Assessment   Psychiatric Diagnoses:   ICD-10-CM   1. Autism  spectrum disorder  F84.0     2. Attention deficit hyperactivity disorder (ADHD), combined type  F90.2     3. Obsessive-compulsive disorder, unspecified type  F42.9     4. Generalized anxiety disorder  F41.1      Patient complexity: Moderate   Patient Education and Counseling:  Supportive therapy provided for identified psychosocial stressors.  Medication education provided and decisions regarding medication regimen discussed with patient/guardian.   On assessment today, Connar has continued to improve some with his grief since his last visit. This does remain to a large degree however. Overall I do not feel we need to adjust his medicines. I feel that he is resistant to bringing up his grief in therapy, but I have encouraged him to do so. Provided supportive therapy. He denies any SI/HI/AVH.    Plan  Medication management:             - Continue Concerta  54mg  daily             - Continue Wellbutrin  XL 300mg  daily  - Continue Prozac  40mg  daily for anxiety and mood  - Klonopin  0.5mg  BID PRN  Labs/Studies:  - none today  Additional recommendations:  - Continue with current therapist, Crisis plan reviewed and patient verbally contracts for safety. Go to ED with emergent symptoms or safety concerns, and Risks, benefits, side effects of medications, including any / all black box warnings, discussed with patient, who verbalizes their understanding   Follow Up: Return in 2 month - Call in the interim for any side-effects, decompensation, questions, or problems between now and the next visit.   I have spent 30 minutes reviewing the patients chart, meeting with the patient and family, and reviewing medicines and side effects.  I spent 16 minutes providing supportive therapy, including empathic validation, praise, normalizing, discussing interpersonal communication skills, grief to both the child and their guardian for their current social and familial stressors.   Selinda GORMAN Lauth,  MD [facility]

## 2023-11-17 ENCOUNTER — Ambulatory Visit: Payer: BC Managed Care – PPO | Admitting: Psychiatry

## 2023-12-21 ENCOUNTER — Ambulatory Visit: Payer: BC Managed Care – PPO | Admitting: Cardiology

## 2023-12-27 ENCOUNTER — Ambulatory Visit: Payer: BC Managed Care – PPO | Admitting: Neurology

## 2023-12-27 ENCOUNTER — Encounter: Payer: Self-pay | Admitting: Neurology

## 2023-12-27 VITALS — BP 136/86 | HR 84 | Resp 20 | Ht 72.0 in | Wt 224.0 lb

## 2023-12-27 DIAGNOSIS — G43109 Migraine with aura, not intractable, without status migrainosus: Secondary | ICD-10-CM

## 2023-12-27 DIAGNOSIS — R55 Syncope and collapse: Secondary | ICD-10-CM | POA: Diagnosis not present

## 2023-12-27 NOTE — Patient Instructions (Signed)
 Good to see you  Continue migraine calendar, if any change, let me know if you would like to start a migraine preventative medication  2. Proceed with Cardiology evaluation  3. Follow-up in 1 year, call for any changes

## 2023-12-27 NOTE — Progress Notes (Signed)
 NEUROLOGY FOLLOW UP OFFICE NOTE  Theodore Woods [id number] Dec 12, 2000  HISTORY OF PRESENT ILLNESS: I had the pleasure of seeing Theodore Woods in follow-up in the neurology clinic on 12/27/2023.  The patient was last seen 10 months ago for recurrent episodes of loss of consciousness and migraines. He is again accompanied by his mother who helps supplement the history today.  Records and images were personally reviewed where available.  MRI brain and EEG unremarkable. Echocardiogram in 03/2019 showed EF 55-60%, moderately dilated right ventricle, mildly dilated right atrium. He had seen Cardiology in 2020 and had a heart monitor that was reportedly normal, results unavailable for review, however typical events were not captured. Review today indicates cardiac monitor showed symptomatic sinus tachycardia between 106 and 150. He was offered a stress test that was "completely optional," so they decided to hold off. Our office ordered a repeat echocardiogram in 04/2023 with EF 60-65%, RV systolic function is mildly reduced, normal ventricular size, right atrial size normal. He was referred for a second opinion from Cardiology for syncope and dizziness, he is scheduled to see Dr. Rosemary Holms in April.  On his last visit, we discussed starting Topiramate 25mg  daily for migraine prophylaxis due to frequent migraines (2-3 times a week). He decided not to take it due to concern for potential side effects. He reports reduction in migraines, he has around 2 a month, if he does not address it early, migraines can last longer. He takes 4 Advil and lays down. He has dizzy spells a couple of times a month lasting around 5 minutes, independent of the migraines. No full syncope since December 2018. He denies any vision changes, focal numbness/tingling/weakness, no falls. He gets 6 hours of sleep.    History on Initial Assessment 01/17/2018: This is an 23 year old right-handed man with a history of anxiety, ADHD,  presenting for evaluation of recurrent episodes of loss of consciousness. He initially started having them when he was at band camp in the summer and was attributed to dehydration. He would start feeling really hot and lightheaded, then pass out. His mother had instructions for co-parents on what to do for him, co-parents have described him as being pale when they come to him. After the syncopal events, he would have a headache with right-sided throbbing, no nausea/vomiting. In December, he had an episode that occurred outside of band. He was standing then started feeling weird like something was wrong, then suddenly had a horrible headache and woke up on the ground. He states this headache was different with a lot more pain, nausea, and headache lasted for several hours after, with sensitivity to light and sound. His vision felt blurred. Another time he was inside an [employer] for a concert. He got up and started having a bad headache and feeling lightheaded. He sat down and felt like he could not move well. He told his teacher and sat inside a room and called his mother. His mother was upset that he was left alone, she "had to keep him from passing out," screaming at him on the phone because he was not making sense trying to communicate with her. She kept screaming on the phone until he finally said "yeah" and she found him half-slouched on the wall sitting on the floor, pale and clammy with zero energy. He had not been doing anything strenuous that time. The last episode occurred in January, he was driving when he felt lightheaded and his head really hurt, similar to the  headache in December. His father switched seats with him, and as he moved to the passenger seat he nearly passed out. They denied any true loss of consciousness this time. The headache again lasted for a few hours. He has not had any further syncopal episodes but has complained of feeling lightheaded. He usually has to lay down and go  to sleep, Advil helps some with the headaches.    He and his mother report that he was having daily headaches in January and was "eating Advil." He started Claritin with a noticeable decrease in headaches last month. He has not needed Advil in a couple of weeks. He still has milder headaches around twice a week on the right side, but has talked to his mother about the headaches being all over as well. He has occasional high pitched tinnitus. He has occasional neck pain. He was previously on Lexapro then switched to Prozac last month for anxiety. He has not taken Xanax yet. He takes Concerta daily for ADHD. His mother denies any staring spells. He denies any olfactory/gustatory hallucinations, deja vu, rising epigastric sensation, focal numbness/tingling/weakness, myoclonic jerks. No diplopia, dysarthria/dysphagia, bowel/bladder dysfunction. Sleep is okay, he usually gest 8 hours of sleep. There is a family history of migraines in his mother and maternal grandmother.  He had a normal birth and early development.  There is no history of febrile convulsions, CNS infections such as meningitis/encephalitis, significant traumatic brain injury, neurosurgical procedures, or family history of seizures  PAST MEDICAL HISTORY: Past Medical History:  Diagnosis Date   ADHD (attention deficit hyperactivity disorder)    Allergic rhinitis    Anxiety    Followed by Dr. Denman George and Dr. Toni Arthurs   Autism spectrum disorder    GERD without esophagitis 03/08/2018   Syncope 07/2018   Weight loss     MEDICATIONS: Current Outpatient Medications on File Prior to Visit  Medication Sig Dispense Refill   buPROPion (WELLBUTRIN XL) 300 MG 24 hr tablet Take 1 tablet (300 mg total) by mouth every morning. 90 tablet 1   clonazePAM (KLONOPIN) 0.5 MG disintegrating tablet Take 1 tablet (0.5 mg total) by mouth 2 (two) times daily as needed. 30 tablet 0   FLUoxetine (PROZAC) 40 MG capsule Take 1 capsule (40 mg total) by mouth daily. 90  capsule 1   ibuprofen (ADVIL,MOTRIN) 200 MG tablet Take 600 mg by mouth every 6 (six) hours as needed. For pain.     loratadine (CLARITIN) 10 MG tablet Take 10 mg by mouth daily as needed. For allergies.     Methylphenidate HCl ER 54 MG TB24 Take 54 mg by mouth daily.     Multiple Vitamin (MULTIVITAMIN) tablet Take 1 tablet by mouth daily.     omeprazole (PRILOSEC) 20 MG capsule Take 20 mg by mouth every other day.   3   topiramate (TOPAMAX) 25 MG tablet Take 1 tablet every morning (Patient not taking) 30 tablet 6   No current facility-administered medications on file prior to visit.    ALLERGIES: No Known Allergies  FAMILY HISTORY: Family History  Problem Relation Age of Onset   Migraines Mother    Anxiety disorder Mother    Asthma Sister    Heart disease Maternal Grandmother    Hyperlipidemia Maternal Grandmother    Hypertension Maternal Grandmother    Diabetes Father    Hypertension Father    Bladder Cancer Maternal Grandfather    Cancer Paternal Grandfather     SOCIAL HISTORY: Social History   Socioeconomic  History   Marital status: Single    Spouse name: Not on file   Number of children: Not on file   Years of education: Not on file   Highest education level: Not on file  Occupational History   Not on file  Tobacco Use   Smoking status: Never   Smokeless tobacco: Never  Vaping Use   Vaping status: Never Used  Substance and Sexual Activity   Alcohol use: No   Drug use: No   Sexual activity: Never  Other Topics Concern   Not on file  Social History Narrative   Pt lives in split level home with his parents, and his sister who is currently in Automotive engineer (home during breaksf)   Currently a senior in high school   Right handed    Social Drivers of Health   Financial Resource Strain: Not on file  Food Insecurity: Not on file  Transportation Needs: Not on file  Physical Activity: Not on file  Stress: Not on file  Social Connections: Not on file  Intimate  Partner Violence: Not on file     PHYSICAL EXAM: Vitals:   12/27/23 1452  BP: 136/86  Pulse: 84  Resp: 20   General: No acute distress Head:  Normocephalic/atraumatic Skin/Extremities: No rash, no edema Neurological Exam: alert and awake. No aphasia or dysarthria. Fund of knowledge is appropriate.  Attention and concentration are normal.   Cranial nerves: Pupils equal, round. Extraocular movements intact with no nystagmus. Visual fields full.  No facial asymmetry.  Motor: Bulk and tone normal, muscle strength 5/5 throughout with no pronator drift.   Finger to nose testing intact.  Gait narrow-based and steady, able to tandem walk adequately.  Romberg negative.   IMPRESSION: This is a 23 yo RH man with a history of anxiety, ADHD, with recurrent episodes of near syncope/syncope. He denies any syncopal episodes since 2018, he used to have syncope followed by significant headaches. He continues to have recurrent dizziness, prior cardiac monitor showed symptomatic sinus tachycardia. He has an appointment with Dr. Rosemary Holms for a second opinion. He reports migraines occurring around twice a month and would like to hold off on migraine preventative medication due to concern for side effects. Continue to monitor, we discussed indications for migraine preventative medication, he will let us know when he is ready to start one. Follow-up in 1 year, call for any changes.   Thank you for allowing me to participate in his care.  Please do not hesitate to call for any questions or concerns.    Patrcia Dolly, M.D.   CC: Dr. Shary Decamp

## 2024-01-26 ENCOUNTER — Ambulatory Visit: Payer: BC Managed Care – PPO | Admitting: Psychiatry

## 2024-02-01 ENCOUNTER — Ambulatory Visit: Payer: BC Managed Care – PPO | Admitting: Psychiatry

## 2024-02-27 ENCOUNTER — Ambulatory Visit: Payer: BC Managed Care – PPO | Admitting: Cardiovascular Disease

## 2024-02-28 ENCOUNTER — Ambulatory Visit: Payer: BC Managed Care – PPO | Admitting: Cardiology

## 2024-03-06 ENCOUNTER — Encounter: Payer: Self-pay | Admitting: Psychiatry

## 2024-03-06 ENCOUNTER — Ambulatory Visit (INDEPENDENT_AMBULATORY_CARE_PROVIDER_SITE_OTHER): Admitting: Psychiatry

## 2024-03-06 DIAGNOSIS — F840 Autistic disorder: Secondary | ICD-10-CM | POA: Diagnosis not present

## 2024-03-06 DIAGNOSIS — F429 Obsessive-compulsive disorder, unspecified: Secondary | ICD-10-CM | POA: Diagnosis not present

## 2024-03-06 DIAGNOSIS — F902 Attention-deficit hyperactivity disorder, combined type: Secondary | ICD-10-CM

## 2024-03-06 DIAGNOSIS — F411 Generalized anxiety disorder: Secondary | ICD-10-CM | POA: Diagnosis not present

## 2024-03-06 MED ORDER — FLUOXETINE HCL 40 MG PO CAPS: 40.0000 mg | ORAL_CAPSULE | Freq: Every day | ORAL | 1 refills | Status: DC

## 2024-03-06 MED ORDER — BUPROPION HCL ER (XL) 300 MG PO TB24: 300.0000 mg | ORAL_TABLET | Freq: Every morning | ORAL | 1 refills | Status: DC

## 2024-03-06 MED ORDER — METHYLPHENIDATE HCL ER 54 MG PO TB24: 54.0000 mg | ORAL_TABLET | Freq: Every day | ORAL | 0 refills | Status: AC

## 2024-03-06 NOTE — Progress Notes (Signed)
 [facility] [address], [place]   Follow-up visit  Date of Service: 03/06/2024  CC/Purpose: Routine medication management follow up.    Theodore Woods is a 23 y.o. male with a past psychiatric history of ASD, ADHD, anxiety, depression who presents today for a psychiatric follow up appointment.    The patient was last seen on 11/11/23, at which time the following plan was established: Medication management:             - Continue Concerta 54mg  daily             - Continue Wellbutrin  XL 300mg  daily             - Continue Prozac  40mg  daily for anxiety and mood             - Klonopin  0.5mg  BID PRN _______________________________________________________________________________________ Acute events/encounters since last visit: none    Theodore Woods was interviewed alone. He reports that he has been doing pretty well since his last visit. He has been taking his medicines regularly. He feels that his medicines are doing pretty well at this time. He feels that his mood and anxiety are both doing well and has no major concerns. He has been enjoying video games and working still. He denies any HI/AVH.    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 Sister married and out of the house     No Known Allergies    Labs:  reviewed  Medical diagnoses: Patient Active Problem List   Diagnosis Date Noted   Right ventricular enlargement 04/20/2019   Atypical chest pain 03/22/2019   Dyspnea on exertion 03/22/2019   Autism spectrum disorder    Anxiety     Allergic rhinitis    ADHD (attention deficit hyperactivity disorder)    Syncope 07/02/2018   Attention deficit hyperactivity disorder (ADHD), combined type 03/09/2018   Acne 03/08/2018   Auditory processing disorder 03/08/2018   GERD without esophagitis 03/08/2018   High risk medication use 03/08/2018   Malaise and fatigue 03/08/2018   Migraine without status migrainosus, not intractable 03/08/2018   Moderate episode of recurrent major depressive disorder (HCC) 03/08/2018   Panic disorder with agoraphobia 03/08/2018   Weight loss 03/08/2018   Sprain of anterior talofibular ligament of left ankle 08/03/2016    Psychiatric Specialty Exam: There were no vitals taken for this visit.There is no height or weight on file to calculate BMI.  General Appearance: Neat and Well Groomed  Eye Contact:  Fair  Speech:  Clear and Coherent and Normal Rate  Mood:  Euthymic  Affect:  Congruent  Thought Process:  Goal Directed  Orientation:  Full (Time, Place, and Person)  Thought Content:  Logical  Suicidal Thoughts:  No  Homicidal Thoughts:  No  Memory:  Immediate;   Good  Judgement:  Good  Insight:  Good  Psychomotor Activity:  Normal  Concentration:  Concentration: Good  Recall:  Good  Fund of Knowledge:  Good  Language:  Good  Assets:  Communication Skills Desire for Improvement Financial Resources/Insurance Housing Leisure Time Physical Health Resilience Social Support Talents/Skills Transportation Vocational/Educational  Cognition:  WNL  Assessment   Psychiatric Diagnoses:   ICD-10-CM   1. Autism spectrum disorder  F84.0     2. Attention deficit hyperactivity disorder (ADHD), combined type  F90.2     3. Obsessive-compulsive disorder, unspecified type  F42.9     4. Generalized anxiety disorder  F41.1       Patient complexity: Moderate   Patient Education and Counseling:  Supportive therapy provided for identified psychosocial stressors.  Medication education  provided and decisions regarding medication regimen discussed with patient/guardian.   On assessment today, Theodore Woods has been doing pretty well since his last visit. His mood and anxiety both appear stable at this time. We will not adjust his regimen. He denies any SI/HI/AVH.    Plan  Medication management:             - Continue Concerta 54mg  daily             - Continue Wellbutrin  XL 300mg  daily  - Continue Prozac  40mg  daily for anxiety and mood  - Klonopin  0.5mg  BID PRN  Labs/Studies:  - none today  Additional recommendations:  - Continue with current therapist, Crisis plan reviewed and patient verbally contracts for safety. Go to ED with emergent symptoms or safety concerns, and Risks, benefits, side effects of medications, including any / all black box warnings, discussed with patient, who verbalizes their understanding   Follow Up: Return in 3 month - Call in the interim for any side-effects, decompensation, questions, or problems between now and the next visit.   I have spent 30 minutes reviewing the patients chart, meeting with the patient and family, and reviewing medicines and side effects.   Anniece Base, MD [facility]

## 2024-06-06 ENCOUNTER — Ambulatory Visit (INDEPENDENT_AMBULATORY_CARE_PROVIDER_SITE_OTHER): Admitting: Psychiatry

## 2024-06-06 ENCOUNTER — Encounter: Payer: Self-pay | Admitting: Psychiatry

## 2024-06-06 DIAGNOSIS — F429 Obsessive-compulsive disorder, unspecified: Secondary | ICD-10-CM | POA: Diagnosis not present

## 2024-06-06 DIAGNOSIS — F840 Autistic disorder: Secondary | ICD-10-CM

## 2024-06-06 DIAGNOSIS — F902 Attention-deficit hyperactivity disorder, combined type: Secondary | ICD-10-CM | POA: Diagnosis not present

## 2024-06-06 MED ORDER — BUPROPION HCL ER (XL) 300 MG PO TB24: 300.0000 mg | ORAL_TABLET | Freq: Every morning | ORAL | 1 refills | Status: AC

## 2024-06-06 MED ORDER — FLUOXETINE HCL 40 MG PO CAPS: 40.0000 mg | ORAL_CAPSULE | Freq: Every day | ORAL | 1 refills | Status: AC

## 2024-06-06 NOTE — Progress Notes (Signed)
 [facility] [address], [place]   Follow-up visit  Date of Service: 06/06/2024  CC/Purpose: Routine medication management follow up.    Theodore Woods is a 23 y.o. male with a past psychiatric history of ASD, ADHD, anxiety, depression who presents today for a psychiatric follow up appointment.    The patient was last seen on 03/06/24, at which time the following plan was established: Medication management:             - Continue Concerta  54mg  daily             - Continue Wellbutrin  XL 300mg  daily             - Continue Prozac  40mg  daily for anxiety and mood             - Klonopin  0.5mg  BID PRN _______________________________________________________________________________________ Acute events/encounters since last visit: none    Forrester was interviewed alone. He feels that things have been going well since his last visit. He has been taking his medicine still with no issues. He feels happy and denies any intense symptoms of anxiety. He has no major concerns at this time. He denies any HI/AVH.    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 Sister married and out of the house     No Known Allergies    Labs:  reviewed  Medical diagnoses: Patient Active Problem List   Diagnosis Date Noted   Right ventricular enlargement 04/20/2019   Atypical chest pain 03/22/2019   Dyspnea on exertion 03/22/2019   Autism spectrum disorder    Anxiety    Allergic rhinitis    ADHD (attention deficit hyperactivity disorder)    Syncope 07/02/2018    Attention deficit hyperactivity disorder (ADHD), combined type 03/09/2018   Acne 03/08/2018   Auditory processing disorder 03/08/2018   GERD without esophagitis 03/08/2018   High risk medication use 03/08/2018   Malaise and fatigue 03/08/2018   Migraine without status migrainosus, not intractable 03/08/2018   Moderate episode of recurrent major depressive disorder (HCC) 03/08/2018   Panic disorder with agoraphobia 03/08/2018   Weight loss 03/08/2018   Sprain of anterior talofibular ligament of left ankle 08/03/2016    Psychiatric Specialty Exam: There were no vitals taken for this visit.There is no height or weight on file to calculate BMI.  General Appearance: Neat and Well Groomed  Eye Contact:  Fair  Speech:  Clear and Coherent and Normal Rate  Mood:  Euthymic  Affect:  Congruent  Thought Process:  Goal Directed  Orientation:  Full (Time, Place, and Person)  Thought Content:  Logical  Suicidal Thoughts:  No  Homicidal Thoughts:  No  Memory:  Immediate;   Good  Judgement:  Good  Insight:  Good  Psychomotor Activity:  Normal  Concentration:  Concentration: Good  Recall:  Good  Fund of Knowledge:  Good  Language:  Good  Assets:  Communication Skills Desire for Improvement Financial Resources/Insurance Housing Leisure Time Physical Health Resilience Social Support Talents/Skills Transportation Vocational/Educational  Cognition:  WNL      Assessment   Psychiatric Diagnoses:   ICD-10-CM   1. Autism spectrum disorder  F84.0  2. Attention deficit hyperactivity disorder (ADHD), combined type  F90.2     3. Obsessive-compulsive disorder, unspecified type  F42.9        Patient complexity: Moderate   Patient Education and Counseling:  Supportive therapy provided for identified psychosocial stressors.  Medication education provided and decisions regarding medication regimen discussed with patient/guardian.   On assessment today, Amine has been stable since  his last visit. We will not adjust his regimen. He denies any SI/HI/AVH.    Plan  Medication management:             - Continue Concerta  54mg  daily             - Continue Wellbutrin  XL 300mg  daily  - Continue Prozac  40mg  daily for anxiety and mood  - Klonopin  0.5mg  BID PRN  Labs/Studies:  - none today  Additional recommendations:  - Continue with current therapist, Crisis plan reviewed and patient verbally contracts for safety. Go to ED with emergent symptoms or safety concerns, and Risks, benefits, side effects of medications, including any / all black box warnings, discussed with patient, who verbalizes their understanding   Follow Up: Return in 3 months - Call in the interim for any side-effects, decompensation, questions, or problems between now and the next visit.   I have spent 30 minutes reviewing the patients chart, meeting with the patient and family, and reviewing medicines and side effects.   Selinda GORMAN Lauth, MD [facility]

## 2024-06-15 ENCOUNTER — Ambulatory Visit: Admitting: Cardiology

## 2024-08-13 ENCOUNTER — Ambulatory Visit: Attending: Cardiology | Admitting: Cardiology

## 2024-08-13 ENCOUNTER — Ambulatory Visit: Admitting: Cardiology

## 2024-08-13 ENCOUNTER — Encounter: Payer: Self-pay | Admitting: Cardiology

## 2024-08-13 VITALS — BP 133/86 | HR 87 | Ht 72.0 in

## 2024-08-13 DIAGNOSIS — R002 Palpitations: Secondary | ICD-10-CM | POA: Diagnosis not present

## 2024-08-13 DIAGNOSIS — R55 Syncope and collapse: Secondary | ICD-10-CM

## 2024-08-13 NOTE — Patient Instructions (Addendum)
  Testing/Procedures: 30 day monitor   Your physician has recommended that you wear an event monitor. Event monitors are medical devices that record the heart's electrical activity. Doctors most often us  these monitors to diagnose arrhythmias. Arrhythmias are problems with the speed or rhythm of the heartbeat. The monitor is a small, portable device. You can wear one while you do your normal daily activities. This is usually used to diagnose what is causing palpitations/syncope (passing out).  Echocardiogram  Your physician has requested that you have an echocardiogram. Echocardiography is a painless test that uses sound waves to create images of your heart. It provides your doctor with information about the size and shape of your heart and how well your heart's chambers and valves are working. This procedure takes approximately one hour. There are no restrictions for this procedure. Please do NOT wear cologne, perfume, aftershave, or lotions (deodorant is allowed). Please arrive 15 minutes prior to your appointment time.  Please note: We ask at that you not bring children with you during ultrasound (echo/ vascular) testing. Due to room size and safety concerns, children are not allowed in the ultrasound rooms during exams. Our front office staff cannot provide observation of children in our lobby area while testing is being conducted. An adult accompanying a patient to their appointment will only be allowed in the ultrasound room at the discretion of the ultrasound technician under special circumstances. We apologize for any inconvenience.  Counter Pressure maneuver education given   Follow-Up: At [facility], you and your health needs are our priority.  As part of our continuing mission to provide you with exceptional heart care, our providers are all part of one team.  This team includes your primary Cardiologist (physician) and Advanced Practice Providers or APPs (Physician Assistants  and Nurse Practitioners) who all work together to provide you with the care you need, when you need it.  Your next appointment:   3 month(s)  Provider:   One of our Advanced Practice Providers (APPs): Morse Clause, PA-C  Lamarr Satterfield, NP Miriam Shams, NP  Olivia Pavy, PA-C Josefa Beauvais, NP  Leontine Salen, PA-C Orren Fabry, PA-C  [place], PA-C Ernest Dick, NP  Damien Braver, NP Jon Hails, PA-C  Waddell Donath, PA-C    Dayna Dunn, PA-C  Scott Weaver, PA-C Lum Louis, NP Katlyn West, NP Callie Goodrich, PA-C  Xika Zhao, NP Sheng Haley, PA-C    Kathleen Johnson, PA-C

## 2024-08-13 NOTE — Progress Notes (Signed)
 Cardiology Office Note:  .   Date:  08/13/2024  ID:  Theodore Woods, DOB 21-Dec-2000, MRN [id number] PCP: [name]., MD  [name] Cardiologist:  Newman Lawrence, MD PCP: [name]., MD  Chief Complaint  Patient presents with   New Patient (Initial Visit)   Palpitations   Near Syncope     Theodore Woods is a 23 y.o. male with ADHD, palpitations, near-syncope   Discussed the use of AI scribe software for clinical note transcription with the patient, who gave verbal consent to proceed.  History of Present Illness Theodore Woods is a 23 year old male who presents with episodes of near syncope and palpitations. He was referred by his neurologist for evaluation of his passing out episodes.  He experiences near syncope episodes with palpitations, dizziness, feeling hot, nausea, and pallor every one to two weeks, lasting about three minutes. Episodes often start with heart racing and are more frequent when standing, causing weakness and a wobbly feeling. He has not lost consciousness since high school, where he had an episode during a marching band event.  He takes Concerta  daily for ADHD. He does not smoke, drink alcohol, or use recreational drugs. He consumes over 64 ounces of water daily and minimal caffeine.  Family history includes heart disease in his mother, diagnosed in her 72s or 24s, and hypertension in his father. He experiences chest tightness for a few seconds during episodes but no chest pain or shortness of breath. He lives in [place] and works running a [employer], with less physical activity than recommended.      Vitals:   08/13/24 1314  BP: 133/86  Pulse: 87  SpO2: 97%      Review of Systems  Cardiovascular:  Positive for near-syncope and palpitations. Negative for chest pain, dyspnea on exertion, leg swelling and syncope.        Studies Reviewed: SABRA        EKG 08/13/2024: Normal sinus rhythm Normal  ECG When compared with ECG of 10-Nov-2017 14:10, No significant change was found  Echocardiogram 04/2023: 1. Left ventricular ejection fraction, by estimation, is 60 to 65%. The  left ventricle has normal function. The left ventricle has no regional  wall motion abnormalities. Left ventricular diastolic parameters were  normal.   2. Right ventricular systolic function is mildly reduced. The right  ventricular size is normal. A Prominent moderator band is visualized.   3. The mitral valve is normal in structure. Trivial mitral valve  regurgitation. No evidence of mitral stenosis.   4. The aortic valve is normal in structure. Aortic valve regurgitation is  not visualized. No aortic stenosis is present.   5. The inferior vena cava is normal in size with greater than 50%  respiratory variability, suggesting right atrial pressure of 3 mmHg.    Physical Exam Vitals and nursing note reviewed.  Constitutional:      General: He is not in acute distress. Neck:     Vascular: No JVD.  Cardiovascular:     Rate and Rhythm: Normal rate and regular rhythm.     Heart sounds: Normal heart sounds. No murmur heard. Pulmonary:     Effort: Pulmonary effort is normal.     Breath sounds: Normal breath sounds. No wheezing or rales.  Musculoskeletal:     Right lower leg: No edema.     Left lower leg: No edema.      VISIT DIAGNOSES:   ICD-10-CM   1.  Near syncope  R55 EKG 12-Lead    Cardiac event monitor    ECHOCARDIOGRAM COMPLETE    2. Palpitations  R00.2 Cardiac event monitor    ECHOCARDIOGRAM COMPLETE       Theodore Woods is a 23 y.o. male with ADHD, palpitations, near-syncope  Assessment & Plan Recurrent near syncope with palpitations: Recurrent presyncope with palpitations, likely vasovagal syncope. No structural heart abnormality on echocardiogram to explain his symptoms, although RV function was mildly reduced in 2024. Possible correlation with ADHD stimulant medication  methylphenidate . - Order 30-day cardiac monitor and repeat echocardiogram. - Advise increased fluid intake. - Recommend over-the-counter compression stockings. - Provide educational material on counter pressure maneuvers. - Schedule follow-up in 3 months.    F/u in 3 months  Signed, Newman JINNY Lawrence, MD

## 2024-08-30 ENCOUNTER — Telehealth: Payer: Self-pay | Admitting: Cardiology

## 2024-08-30 NOTE — Telephone Encounter (Signed)
 Returned call to patients mother and answered all her questions. Patient is autistic and having sensory issues with the monitor. Patient mom will call and get some sensitive skin patches to see if they would help. If patient is unable to tolerate monitor she will mail it back.

## 2024-08-30 NOTE — Telephone Encounter (Signed)
 Mother Kandice) stated patient is autistic and has worn his heart monitor for a week but the monitor is now off.  Mother wants advice on next steps.

## 2024-09-05 ENCOUNTER — Ambulatory Visit (HOSPITAL_COMMUNITY)
Admission: RE | Admit: 2024-09-05 | Discharge: 2024-09-05 | Disposition: A | Discharge status: Home / Self Care | Source: Ambulatory Visit | Attending: Cardiology | Admitting: Cardiology

## 2024-09-05 DIAGNOSIS — R002 Palpitations: Secondary | ICD-10-CM

## 2024-09-05 DIAGNOSIS — R55 Syncope and collapse: Secondary | ICD-10-CM | POA: Diagnosis present

## 2024-09-05 LAB — ECHOCARDIOGRAM COMPLETE
Area-P 1/2: 3.47 cm2
S' Lateral: 3.1 cm

## 2024-09-07 ENCOUNTER — Ambulatory Visit: Payer: Self-pay | Admitting: Cardiology

## 2024-09-10 ENCOUNTER — Ambulatory Visit: Admitting: Psychiatry

## 2024-09-12 ENCOUNTER — Encounter: Payer: Self-pay | Admitting: Neurology

## 2024-09-26 ENCOUNTER — Ambulatory Visit: Attending: Cardiology

## 2024-09-26 DIAGNOSIS — R002 Palpitations: Secondary | ICD-10-CM | POA: Diagnosis not present

## 2024-09-26 DIAGNOSIS — R55 Syncope and collapse: Secondary | ICD-10-CM

## 2024-10-12 ENCOUNTER — Ambulatory Visit: Admitting: Psychiatry

## 2024-11-07 ENCOUNTER — Ambulatory Visit: Attending: Cardiovascular Disease | Admitting: Cardiology

## 2024-11-07 ENCOUNTER — Encounter: Payer: Self-pay | Admitting: Cardiology

## 2024-11-07 VITALS — BP 138/92 | HR 71 | Ht 72.0 in | Wt 224.0 lb

## 2024-11-07 DIAGNOSIS — R55 Syncope and collapse: Secondary | ICD-10-CM

## 2024-11-07 NOTE — Patient Instructions (Addendum)
" °  Follow-Up: At [facility], you and your health needs are our priority.  As part of our continuing mission to provide you with exceptional heart care, our providers are all part of one team.  This team includes your primary Cardiologist (physician) and Advanced Practice Providers or APPs (Physician Assistants and Nurse Practitioners) who all work together to provide you with the care you need, when you need it.  Your next appointment:   6 month(s)  Provider:   One of our Advanced Practice Providers (APPs): Morse Clause, PA-C  Lamarr Satterfield, NP Miriam Shams, NP  Olivia Pavy, PA-C Josefa Beauvais, NP  Leontine Salen, PA-C Orren Fabry, PA-C  [place], PA-C Ernest Dick, NP  Damien Braver, NP Jon Hails, PA-C  Waddell Donath, PA-C    Dayna Dunn, PA-C  Scott Weaver, PA-C Lum Louis, NP Katlyn West, NP Callie Goodrich, PA-C  Xika Zhao, NP Sheng Haley, PA-C    Kathleen Johnson, PA-C    Other Instructions  Counter Pressure Maneuver handout           "

## 2024-11-07 NOTE — Progress Notes (Signed)
 " Cardiology Office Note:  .   Date:  11/07/2024  ID:  Theodore Woods, DOB Mar 28, 2001, MRN [id number] PCP: [name]., MD  [facility] Providers Cardiologist:  Newman Lawrence, MD PCP: [name]., MD  Chief Complaint  Patient presents with   Syncope     Theodore Woods is a 24 y.o. male with ADHD, palpitations, near-syncope   History of Present Illness Patient is here today with his mother.  He has had at least 2 episodes of syncope since he was found in 08/2024.  Both episodes were short lasting, 30 to 60 seconds, preceded by warning symptoms of lightheadedness and blacking out, occurred while standing.  Patient drinks about 4-5 bottles of water every day, does not use compression stockings.  Discussed results of recent tests with the patient and his mother, details below.      Vitals:   11/07/24 0955  BP: (!) 138/92  Pulse: 71  SpO2: 99%       Review of Systems  Cardiovascular:  Positive for palpitations and syncope. Negative for chest pain, dyspnea on exertion, leg swelling and near-syncope.        Studies Reviewed: SABRA        EKG 08/13/2024: Normal sinus rhythm Normal ECG When compared with ECG of 10-Nov-2017 14:10, No significant change was found  Echocardiogram 09/2024:  1. Left ventricular ejection fraction, by estimation, is 55 to 60%. Left  ventricular ejection fraction by 3D volume is 55 %. The left ventricle has  normal function. The left ventricle has no regional wall motion  abnormalities. Left ventricular diastolic   parameters were normal. The average left ventricular global longitudinal  strain is -23.1 %. The global longitudinal strain is normal.   2. Right ventricular systolic function is normal. The right ventricular  size is normal.   3. The mitral valve is normal in structure. No evidence of mitral valve  regurgitation. No evidence of mitral stenosis.   4. There is mild apical displacement of the TV but does not  meet cut off  for Ebstein anomaly (only 6 mm/m^2).   5. The aortic valve is normal in structure. Aortic valve regurgitation is  not visualized. No aortic stenosis is present.   6. The inferior vena cava is normal in size with greater than 50%  respiratory variability, suggesting right atrial pressure of 3 mmHg.   Event monitor 30 days 08/22/2024 - 09/20/2024: Dominant rhythm: Sinus.  Average heart rate 80 bpm. No atrial fibrillation/atrial flutter/SVT/VT/high grade AV block, sinus pause >3sec noted. <1% PAC/PVC. Manual triggered events correlated with sinus tachycardia.    Physical Exam Vitals and nursing note reviewed.  Constitutional:      General: He is not in acute distress. Neck:     Vascular: No JVD.  Cardiovascular:     Rate and Rhythm: Normal rate and regular rhythm.     Heart sounds: Normal heart sounds. No murmur heard. Pulmonary:     Effort: Pulmonary effort is normal.     Breath sounds: Normal breath sounds. No wheezing or rales.  Musculoskeletal:     Right lower leg: No edema.     Left lower leg: No edema.      VISIT DIAGNOSES:   ICD-10-CM   1. Vasovagal syncope  R55         Theodore Woods is a 24 y.o. male with ADHD, palpitations, near-syncope  Assessment & Plan Recurrent near syncope with palpitations: Recurrent presyncope with palpitations, likely vasovagal  syncope.  Personally reviewed echocardiogram.  RV function is normal.  Today reported lightheadedness in 2024.  There is mild apical displacement of the tricuspid valve, but I think this is a normal variant and no significant abnormality.   No significant arrhythmia noted on cardiac monitor. - Advise increased fluid intake. - Recommend over-the-counter compression stockings. - Provide educational material on counter pressure maneuvers.     F/u in 3 months  Signed, Newman JINNY Lawrence, MD  "

## 2024-11-21 ENCOUNTER — Ambulatory Visit: Admitting: Psychiatry

## 2024-12-24 ENCOUNTER — Ambulatory Visit: Admitting: Psychiatry

## 2024-12-26 ENCOUNTER — Ambulatory Visit: Payer: BC Managed Care – PPO | Admitting: Neurology
